# Patient Record
Sex: Male | Born: 1960 | Race: Black or African American | Hispanic: No | Marital: Married | Smoking: Never smoker
Health system: Southern US, Community
[De-identification: ages and names within clinical notes are randomized; demographics above are authoritative.]

## PROBLEM LIST (undated history)

## (undated) DIAGNOSIS — I10 Essential (primary) hypertension: Secondary | ICD-10-CM

## (undated) DIAGNOSIS — N189 Chronic kidney disease, unspecified: Secondary | ICD-10-CM

## (undated) DIAGNOSIS — K8020 Calculus of gallbladder without cholecystitis without obstruction: Secondary | ICD-10-CM

## (undated) HISTORY — PX: HERNIA REPAIR: SHX51

## (undated) HISTORY — DX: Calculus of gallbladder without cholecystitis without obstruction: K80.20

## (undated) HISTORY — PX: KIDNEY STONE SURGERY: SHX686

## (undated) HISTORY — DX: Essential (primary) hypertension: I10

## (undated) HISTORY — DX: Chronic kidney disease, unspecified: N18.9

---

## 2002-07-21 HISTORY — PX: CARPAL TUNNEL RELEASE: SHX101

## 2006-05-27 ENCOUNTER — Encounter: Admission: RE | Admit: 2006-05-27 | Discharge: 2006-05-27 | Payer: Self-pay | Admitting: Family Medicine

## 2009-08-22 ENCOUNTER — Emergency Department (HOSPITAL_COMMUNITY): Admission: EM | Admit: 2009-08-22 | Discharge: 2009-08-22 | Payer: Self-pay | Admitting: Emergency Medicine

## 2011-03-13 ENCOUNTER — Other Ambulatory Visit: Payer: Self-pay | Admitting: Family Medicine

## 2011-03-18 ENCOUNTER — Other Ambulatory Visit: Payer: Self-pay

## 2011-03-19 ENCOUNTER — Other Ambulatory Visit: Payer: Self-pay | Admitting: Family Medicine

## 2011-03-19 ENCOUNTER — Ambulatory Visit
Admission: RE | Admit: 2011-03-19 | Discharge: 2011-03-19 | Disposition: A | Discharge status: Home / Self Care | Payer: BC Managed Care – PPO | Source: Ambulatory Visit | Attending: Family Medicine | Admitting: Family Medicine

## 2011-04-08 ENCOUNTER — Ambulatory Visit (INDEPENDENT_AMBULATORY_CARE_PROVIDER_SITE_OTHER): Payer: BC Managed Care – PPO | Admitting: Surgery

## 2011-04-18 ENCOUNTER — Encounter (INDEPENDENT_AMBULATORY_CARE_PROVIDER_SITE_OTHER): Payer: Self-pay | Admitting: General Surgery

## 2011-04-18 ENCOUNTER — Ambulatory Visit (INDEPENDENT_AMBULATORY_CARE_PROVIDER_SITE_OTHER): Payer: BC Managed Care – PPO | Admitting: General Surgery

## 2011-04-18 DIAGNOSIS — K8020 Calculus of gallbladder without cholecystitis without obstruction: Secondary | ICD-10-CM

## 2011-04-18 NOTE — Progress Notes (Signed)
Chief Complaint  Patient presents with  . Other    NP- eval Gb with stones    HPI Daniel Velez is a 50 y.o. male.   HPI  50 year old African American male referred by his primary care physician for evaluation of gallstones. The patient states that he was on his vacation several weeks ago and he ate a lot of food at one time. Shortly thereafter he started belching. He had no abdominal pain. He had no nausea or vomiting. He had no bloating. He went to see his doctor who gave a medication for acid reflux. He has had  no other belching episodes. He denies any diarrhea or constipation. He denies any acholic stools. He denies any fevers or chills. He denies any jaundice or scleral icterus. He underwent abdominal ultrasound which demonstrated gallstones which prompted his referral to our office. Past Medical History  Diagnosis Date  . Gallstones   . Hypertension   . Chronic kidney disease     history of kidney stones    Past Surgical History  Procedure Date  . Kidney stone surgery 1978, 1983  . Hernia repair     umbilical hernia  . Carpal tunnel release 2004    Family History  Problem Relation Age of Onset  . Cancer Mother     throat  . Cancer Maternal Grandmother     pancreatic  . Cancer Paternal Grandmother     breast    Social History History  Substance Use Topics  . Smoking status: Never Smoker   . Smokeless tobacco: Not on file  . Alcohol Use: Yes    No Known Allergies  Current Outpatient Prescriptions  Medication Sig Dispense Refill  . BENICAR HCT 40-25 MG per tablet daily.      . niacin 500 MG tablet Take 500 mg by mouth daily with breakfast.          Review of Systems Review of Systems  Constitutional: Negative for fever, chills, activity change, appetite change and unexpected weight change.  HENT: Negative for neck pain and neck stiffness.   Eyes: Negative for photophobia and visual disturbance.  Respiratory: Negative for chest tightness, shortness of  breath and wheezing.   Cardiovascular: Negative for chest pain, palpitations and leg swelling.  Gastrointestinal: Negative for nausea, vomiting, abdominal pain, diarrhea, constipation, blood in stool and abdominal distention.  Genitourinary: Negative for dysuria, hematuria and difficulty urinating.  Musculoskeletal: Negative.   Neurological: Negative.   Hematological: Negative.   Psychiatric/Behavioral: Negative.     Blood pressure 104/74, pulse 64, temperature 97.5 F (36.4 C), temperature source Temporal, resp. rate 16, height 6' (1.829 m), weight 204 lb 9.6 oz (92.806 kg).  Physical Exam Physical Exam  Vitals reviewed. Constitutional: He is oriented to person, place, and time. He appears well-developed and well-nourished.  HENT:  Head: Normocephalic and atraumatic.  Eyes: Conjunctivae are normal. No scleral icterus.  Neck: Normal range of motion. Neck supple. No thyromegaly present.  Cardiovascular: Normal rate, regular rhythm and normal heart sounds.   Pulmonary/Chest: Effort normal and breath sounds normal. He has no wheezes.  Abdominal: Soft. Bowel sounds are normal. He exhibits no distension and no mass. There is no tenderness. There is no guarding.  Musculoskeletal: Normal range of motion.  Lymphadenopathy:    He has no cervical adenopathy.  Neurological: He is alert and oriented to person, place, and time. He exhibits normal muscle tone.  Skin: Skin is warm and dry.  Psychiatric: He has a normal mood and  affect. His behavior is normal. Judgment and thought content normal.    Data Reviewed LIMITED ABDOMINAL ULTRASOUND - RIGHT UPPER QUADRANT 03/19/11 Comparison: None.  Findings:  Gallbladder: Several small gallstones are seen measuring up to 1  cm in size. Gallbladder is nondilated and there is no evidence of  gallbladder wall thickening or sonographic Murphy's sign.   Common bile duct: Within normal limits in caliber. 5 mm in  diameter.  Liver: No focal lesion  identified. Within normal limits in  parenchymal echogenicity.   IMPRESSION:  Cholelithiasis. No sonographic evidence of acute cholecystitis or  biliary dilatation.   Assessment    asymptomatic cholelithiasis HTN Dyslipidemia     Plan    We discussed the etiology of gallstones. We also discussed gallbladder disease. We also discussed the signs and symptoms of symptomatic cholelithiasis and acute cholecystitis. The patient was given educational material.  Since he is asymptomatic, I recommended observation. I encouraged the patient to contact our office if and when he should become symptomatic with respect with gallstones. I will see him on a p.r.n. basis       [name] 04/18/2011, 9:25 AM

## 2011-04-18 NOTE — Patient Instructions (Addendum)
Call or return when you become symptomatic  Cholelithiasis, Gallbladder Disease (Gallstones) Gallstones are a form of gallbladder disease. When there is an infection of the gallbladder it is called cholecystitis. It is usually caused by a build-up of stones (gallstones or cholelithiasis) in your gallbladder. The gallbladder is not an essential organ. This means it is not necessary for life. It is located slightly to the right of center in the belly (abdomen), behind the liver. It stores bile made in the liver. Bile aids in digestion and absorption of fats. Gallbladder disease may result in feeling sick to your stomach (nausea), abdominal pain, and jaundice. In severe cases, emergency surgery may be required. Gallstones are the most common type of gallbladder disease. They begin as small crystals and slowly grow into stones. Gallstone pain occurs when the gallbladder spasms and a gallstone is blocking the duct. Pain can also occur when a stone passes out of the duct. The pain usually begins suddenly. It may persist from several minutes to several hours. Infection can occur. Infection can add to discomfort and severity of an acute attack. The pain may be made worse by breathing deeply or by being jarred. There may be fever and tenderness to the touch. In some cases, when gallstones do not move into the bile duct, people have no pain or symptoms. These are called "silent" gallstones. Women are three times more likely to develop gallstones than men. Women who have had several pregnancies are more likely to have gallbladder disease. Physicians sometimes advise removing diseased gallbladders before future pregnancies. Other factors that increase the risk of gallbladder disease are obesity, diets heavy in fried foods and dairy products, increasing age, prolonged use of medications containing male hormones, and heredity. HOME CARE INSTRUCTIONS  Only take over-the-counter or prescription medicines for pain,  discomfort, or fever as directed by your caregiver.   Follow a low fat diet until seen again. (Fat causes the gallbladder to contract.)   Follow-up as instructed. Attacks are almost always recurrent and surgery is usually required for permanent treatment.  SEEK IMMEDIATE MEDICAL CARE IF:  Pain is increasing and is not controlled by medications.   You have an oral temperature above 101, not controlled by medication.   You develop nausea and vomiting.  MAKE SURE YOU:   Understand these instructions.   Will watch your condition.   Will get help right away if you are not doing well or get worse.  Document Released: 07/03/2005 Document Re-Released: 06/19/2008 [facility] Patient Information 2011 [place], [place].

## 2012-02-09 IMAGING — US US ABDOMEN LIMITED
1 series · 14 of 25 positions shown · non-contrast
Comparison: None.

CLINICAL DATA: Post prandial abdominal pain and belching.

LIMITED ABDOMINAL ULTRASOUND - RIGHT UPPER QUADRANT

[Series 1: us abdomen limited · 0.16mm/px · 14 of 47 slices shown]
[im 1/47]
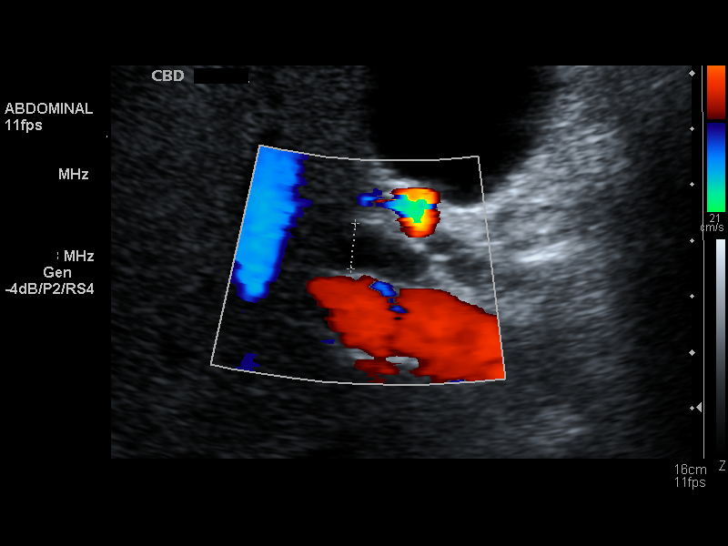
[im 4/47]
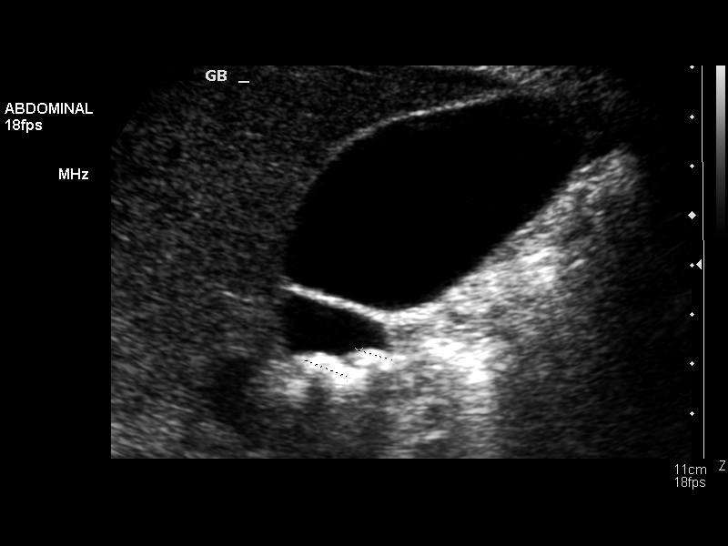
[im 8/47]
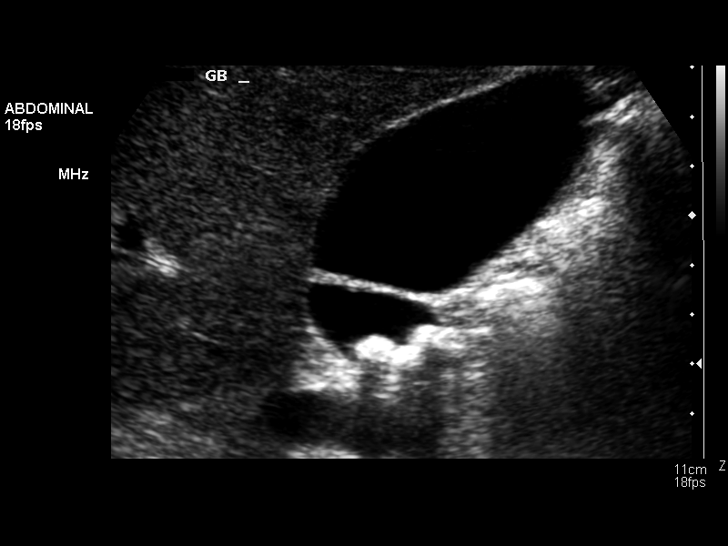
[im 12/47]
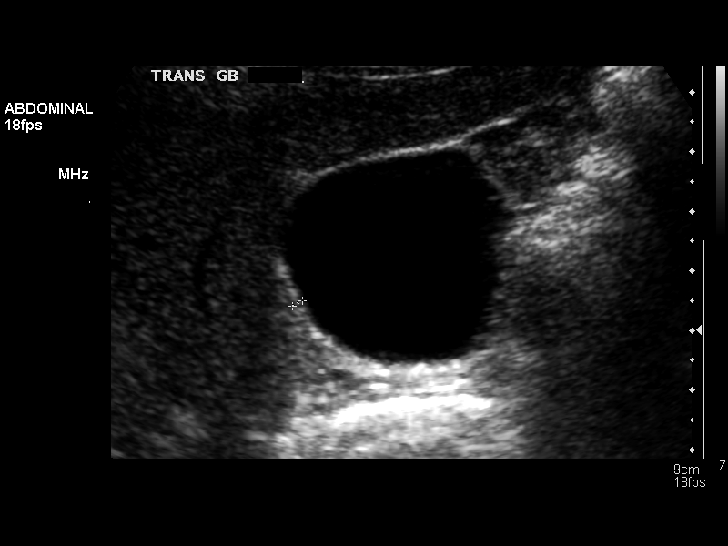
[im 16/47]
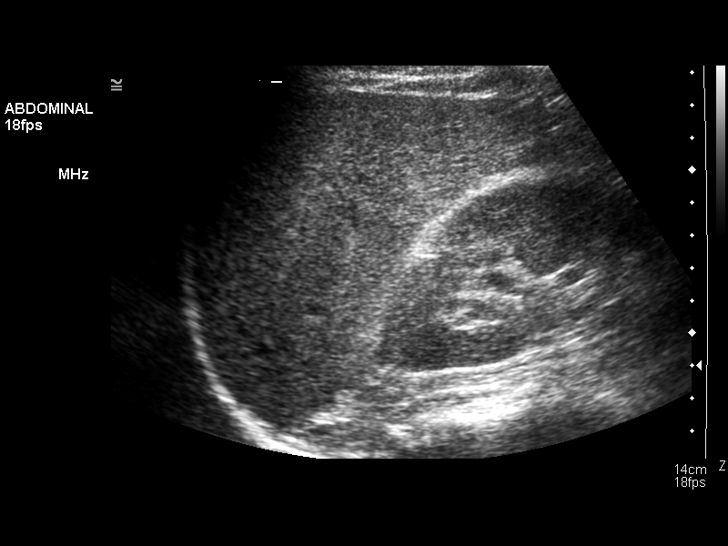
[im 18/47]
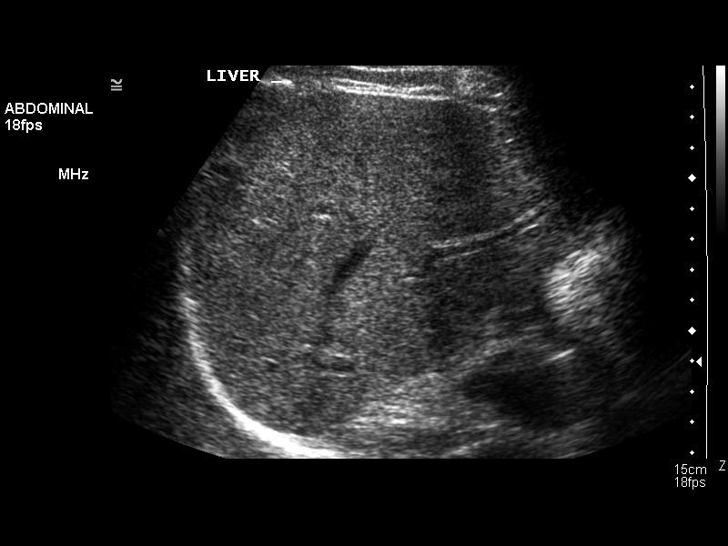
[im 22/47]
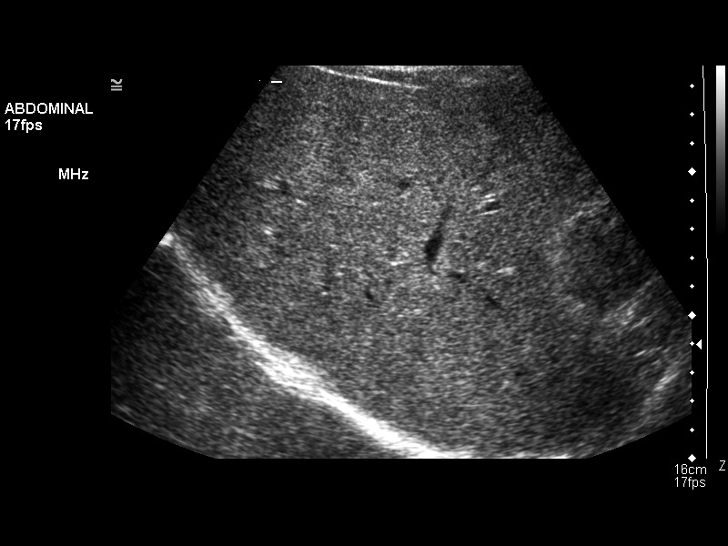
[im 25/47]
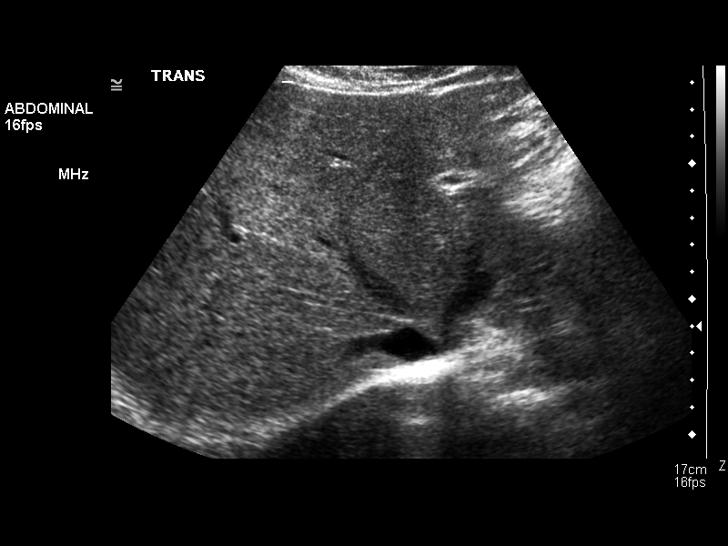
[im 29/47]
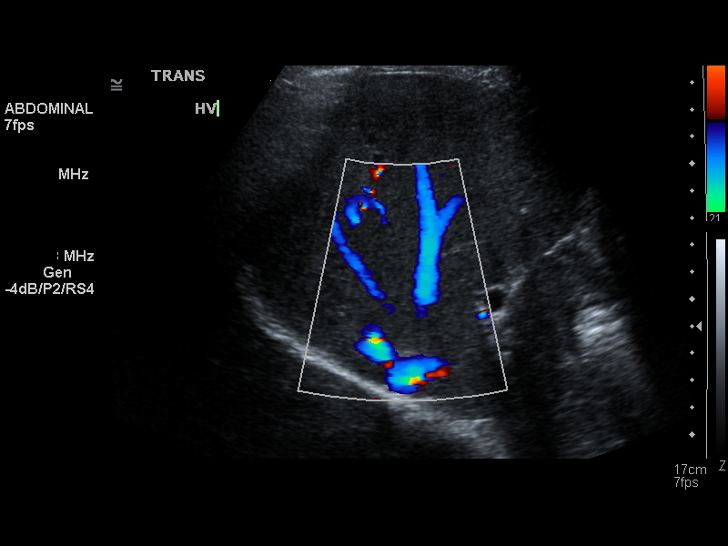
[im 31/47]
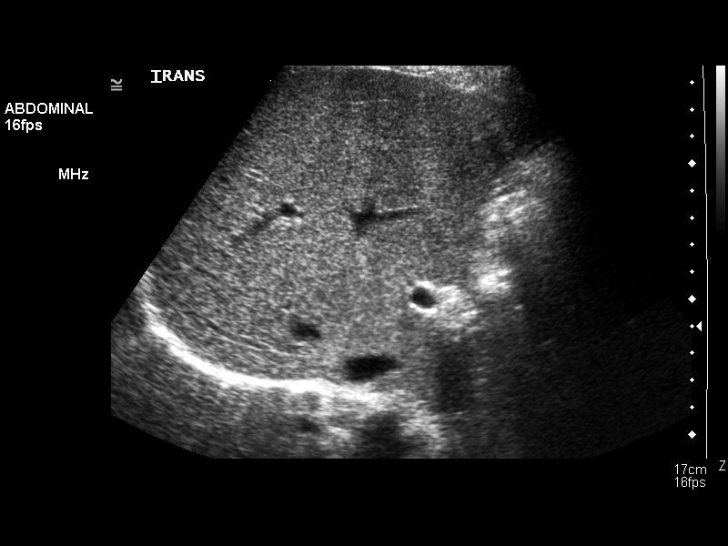
[im 35/47]
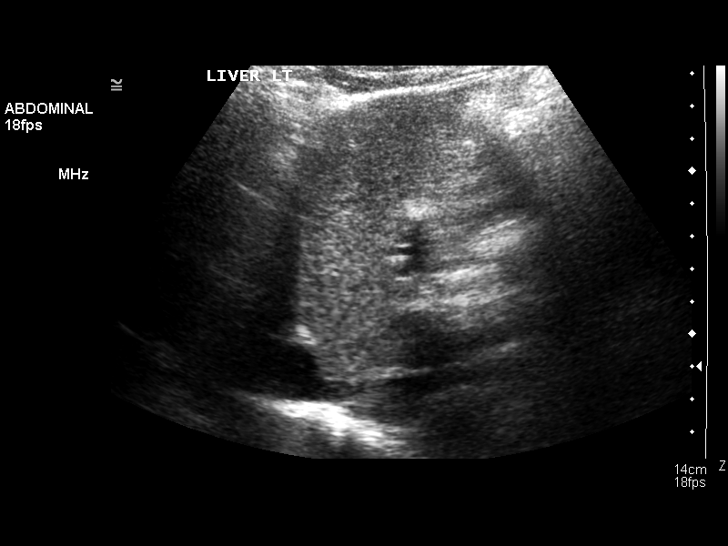
[im 39/47]
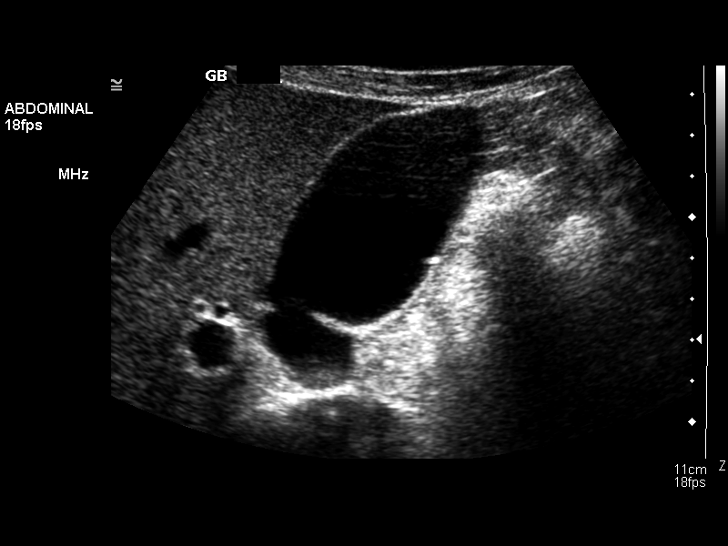
[im 43/47]
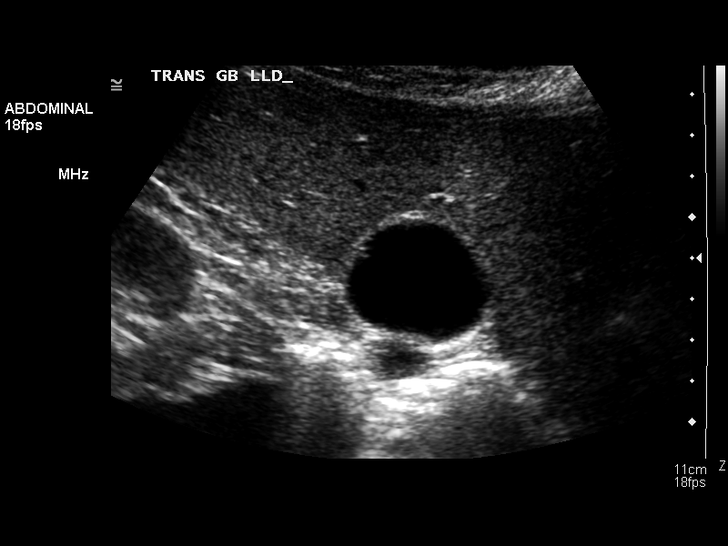
[im 47/47]
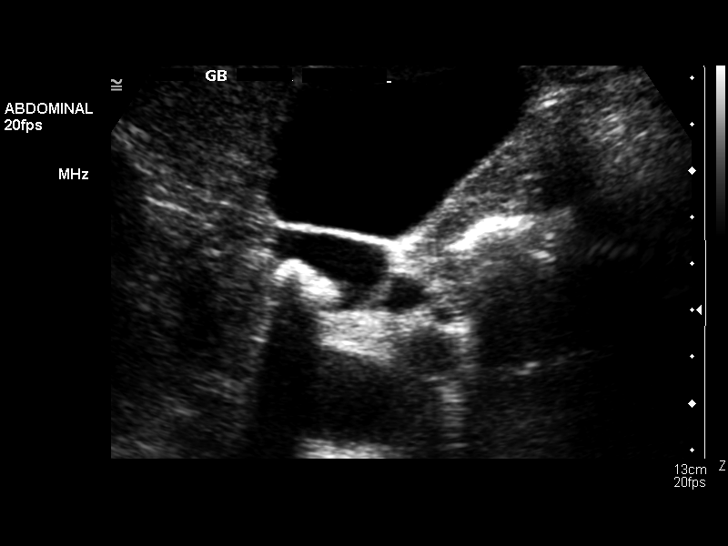

[14 of 25 positions shown; findings below may reference images not displayed]

FINDINGS: Gallbladder:  Several small gallstones are seen measuring up to 1
cm in size.  Gallbladder is nondilated and there is no evidence of
gallbladder wall thickening or sonographic Murphy's sign.

Common bile duct:  Within normal limits in caliber. 5 mm in
diameter.

Liver:  No focal lesion identified.  Within normal limits in
parenchymal echogenicity.
IMPRESSION: Cholelithiasis.  No sonographic evidence of acute cholecystitis or
biliary dilatation.

## 2012-10-19 HISTORY — PX: CERVICAL FUSION: SHX112

## 2013-07-12 ENCOUNTER — Encounter: Payer: Self-pay | Admitting: Internal Medicine

## 2014-08-02 ENCOUNTER — Encounter: Payer: Self-pay | Admitting: Internal Medicine

## 2014-08-30 DIAGNOSIS — Z0130 Encounter for examination of blood pressure without abnormal findings: Secondary | ICD-10-CM | POA: Insufficient documentation

## 2014-09-21 ENCOUNTER — Ambulatory Visit (AMBULATORY_SURGERY_CENTER): Payer: Self-pay

## 2014-09-21 VITALS — Ht 72.0 in | Wt 214.6 lb

## 2014-09-21 DIAGNOSIS — Z1211 Encounter for screening for malignant neoplasm of colon: Secondary | ICD-10-CM

## 2014-09-21 MED ORDER — MOVIPREP 100 G PO SOLR: 1.0000 | Freq: Once | ORAL | Status: DC

## 2014-09-21 NOTE — Progress Notes (Signed)
No allergies to eggs or soy No home oxygen No diet/weight loss meds No past problems with anesthesia  Has email  Emmi instructions given for colonoscopy 

## 2014-10-05 ENCOUNTER — Ambulatory Visit (AMBULATORY_SURGERY_CENTER): Payer: BLUE CROSS/BLUE SHIELD | Admitting: Internal Medicine

## 2014-10-05 ENCOUNTER — Encounter: Payer: Self-pay | Admitting: Internal Medicine

## 2014-10-05 VITALS — BP 111/74 | HR 65 | Temp 96.6°F | Resp 12 | Ht 72.0 in | Wt 214.0 lb

## 2014-10-05 DIAGNOSIS — D124 Benign neoplasm of descending colon: Secondary | ICD-10-CM | POA: Diagnosis not present

## 2014-10-05 DIAGNOSIS — Z1211 Encounter for screening for malignant neoplasm of colon: Secondary | ICD-10-CM

## 2014-10-05 DIAGNOSIS — D123 Benign neoplasm of transverse colon: Secondary | ICD-10-CM

## 2014-10-05 MED ORDER — SODIUM CHLORIDE 0.9 % IV SOLN: 500.0000 mL | INTRAVENOUS | Status: DC

## 2014-10-05 NOTE — Patient Instructions (Signed)
YOU HAD AN ENDOSCOPIC PROCEDURE TODAY AT THE [facility]:   Refer to the procedure report that was given to you for any specific questions about what was found during the examination.  If the procedure report does not answer your questions, please call your gastroenterologist to clarify.  If you requested that your care partner not be given the details of your procedure findings, then the procedure report has been included in a sealed envelope for you to review at your convenience later.  YOU SHOULD EXPECT: Some feelings of bloating in the abdomen. Passage of more gas than usual.  Walking can help get rid of the air that was put into your GI tract during the procedure and reduce the bloating. If you had a lower endoscopy (such as a colonoscopy or flexible sigmoidoscopy) you may notice spotting of blood in your stool or on the toilet paper. If you underwent a bowel prep for your procedure, you may not have a normal bowel movement for a few days.  Please Note:  You might notice some irritation and congestion in your nose or some drainage.  This is from the oxygen used during your procedure.  There is no need for concern and it should clear up in a day or so.  SYMPTOMS TO REPORT IMMEDIATELY:   Following lower endoscopy (colonoscopy or flexible sigmoidoscopy):  Excessive amounts of blood in the stool  Significant tenderness or worsening of abdominal pains  Swelling of the abdomen that is new, acute  Fever of 100F or higher   For urgent or emergent issues, a gastroenterologist can be reached at any hour by calling [phone].   DIET: Your first meal following the procedure should be a small meal and then it is ok to progress to your normal diet. Heavy or fried foods are harder to digest and may make you feel nauseous or bloated.  Likewise, meals heavy in dairy and vegetables can increase bloating.  Drink plenty of fluids but you should avoid alcoholic beverages for 24  hours.  ACTIVITY:  You should plan to take it easy for the rest of today and you should NOT DRIVE or use heavy machinery until tomorrow (because of the sedation medicines used during the test).    FOLLOW UP: Our staff will call the number listed on your records the next business day following your procedure to check on you and address any questions or concerns that you may have regarding the information given to you following your procedure. If we do not reach you, we will leave a message.  However, if you are feeling well and you are not experiencing any problems, there is no need to return our call.  We will assume that you have returned to your regular daily activities without incident.  If any biopsies were taken you will be contacted by phone or by letter within the next 1-3 weeks.  Please call us at [phone] if you have not heard about the biopsies in 3 weeks.    SIGNATURES/CONFIDENTIALITY: You and/or your care partner have signed paperwork which will be entered into your electronic medical record.  These signatures attest to the fact that that the information above on your After Visit Summary has been reviewed and is understood.  Full responsibility of the confidentiality of this discharge information lies with you and/or your care-partner. 

## 2014-10-05 NOTE — Progress Notes (Signed)
Called to room to assist during endoscopic procedure.  Patient ID and intended procedure confirmed with present staff. Received instructions for my participation in the procedure from the performing physician.  

## 2014-10-05 NOTE — Progress Notes (Signed)
Patient awakening,vss,report to rn 

## 2014-10-05 NOTE — Op Note (Signed)
[facility]  [address]. [place], [address]   COLONOSCOPY PROCEDURE REPORT  PATIENT: Daniel Velez, Daniel Velez  MR#: [id number] BIRTHDATE: April 05, 1961 , 62  yrs. old GENDER: male ENDOSCOPIST: Jerene Bears, MD REFERRED PJ:[id number] Ardeth Perfect, M.D. PROCEDURE DATE:  10/05/2014 PROCEDURE:   Colonoscopy with snare polypectomy and Colonoscopy with cold biopsy polypectomy First Screening Colonoscopy - Avg.  risk and is 50 yrs.  old or older Yes.  Prior Negative Screening - Now for repeat screening. N/A  History of Adenoma - Now for follow-up colonoscopy & has been > or = to 3 yrs.  N/A ASA CLASS:   Class II INDICATIONS:Screening for colonic neoplasia and Colorectal Neoplasm Risk Assessment for this procedure is average risk. MEDICATIONS: Monitored anesthesia care and Propofol 200 mg IV  DESCRIPTION OF PROCEDURE:   After the risks benefits and alternatives of the procedure were thoroughly explained, informed consent was obtained.  The digital rectal exam revealed no rectal mass.   The LB [id number]  endoscope was introduced through the anus and advanced to the cecum, which was identified by both the appendix and ileocecal valve. No adverse events experienced. The quality of the prep was (MoviPrep was used) good.  The instrument was then slowly withdrawn as the colon was fully examined.   COLON FINDINGS: Three sessile polyps ranging from 3 to 18mm in size were found in the transverse colon (2) and descending colon (1). Polypectomies were performed with cold forceps (2) and with a cold snare (1).  The resection was complete, the polyp tissue was completely retrieved and sent to histology.   The examination was otherwise normal.  Retroflexed views revealed internal hemorrhoids. The time to cecum = 1 min, 47 sec Withdrawal time = 10 min 20 sec The scope was withdrawn and the procedure completed. COMPLICATIONS: There were no immediate complications.  ENDOSCOPIC IMPRESSION: 1.    Three sessile polyps ranging from 3 to 66mm in size were found in the transverse colon and descending colon; polypectomies were performed with cold forceps and with a cold snare 2.   The examination was otherwise normal  RECOMMENDATIONS: 1.  Await pathology results 2.  Timing of repeat colonoscopy will be determined by pathology findings. 3.  You will receive a letter within 1-2 weeks with the results of your biopsy as well as final recommendations.  Please call my office if you have not received a letter after 3 weeks.  eSigned:  Jerene Bears, MD 10/05/2014 9:38 AM   cc: The Patient, Velna Hatchet, M.D.

## 2014-10-06 ENCOUNTER — Telehealth: Payer: Self-pay | Admitting: *Deleted

## 2014-10-06 NOTE — Telephone Encounter (Signed)
  Follow up Call-  Call back number 10/05/2014  Post procedure Call Back phone  # [phone]  Permission to leave phone message Yes     Patient questions:  Do you have a fever, pain , or abdominal swelling? No. Pain Score  0 *  Have you tolerated food without any problems? Yes.    Have you been able to return to your normal activities? Yes.    Do you have any questions about your discharge instructions: Diet   No. Medications  No. Follow up visit  No.  Do you have questions or concerns about your Care? No.  Actions: * If pain score is 4 or above: No action needed, pain <4.

## 2014-10-10 ENCOUNTER — Encounter: Payer: Self-pay | Admitting: Internal Medicine

## 2015-12-07 DIAGNOSIS — R51 Headache: Secondary | ICD-10-CM | POA: Diagnosis not present

## 2015-12-07 DIAGNOSIS — Z6830 Body mass index (BMI) 30.0-30.9, adult: Secondary | ICD-10-CM | POA: Diagnosis not present

## 2015-12-31 DIAGNOSIS — Z87898 Personal history of other specified conditions: Secondary | ICD-10-CM | POA: Diagnosis not present

## 2015-12-31 DIAGNOSIS — N400 Enlarged prostate without lower urinary tract symptoms: Secondary | ICD-10-CM | POA: Diagnosis not present

## 2016-04-08 DIAGNOSIS — J0100 Acute maxillary sinusitis, unspecified: Secondary | ICD-10-CM | POA: Diagnosis not present

## 2016-04-08 DIAGNOSIS — Z6830 Body mass index (BMI) 30.0-30.9, adult: Secondary | ICD-10-CM | POA: Diagnosis not present

## 2016-04-08 DIAGNOSIS — J302 Other seasonal allergic rhinitis: Secondary | ICD-10-CM | POA: Diagnosis not present

## 2016-09-15 DIAGNOSIS — Z125 Encounter for screening for malignant neoplasm of prostate: Secondary | ICD-10-CM | POA: Diagnosis not present

## 2016-09-15 DIAGNOSIS — Z0000 Encounter for general adult medical examination without abnormal findings: Secondary | ICD-10-CM | POA: Diagnosis not present

## 2016-09-15 DIAGNOSIS — I10 Essential (primary) hypertension: Secondary | ICD-10-CM | POA: Diagnosis not present

## 2016-09-18 ENCOUNTER — Encounter (HOSPITAL_COMMUNITY): Payer: Self-pay | Admitting: Emergency Medicine

## 2016-09-18 ENCOUNTER — Emergency Department (HOSPITAL_COMMUNITY)
Admission: EM | Admit: 2016-09-18 | Discharge: 2016-09-18 | Disposition: A | Discharge status: Home / Self Care | Payer: BLUE CROSS/BLUE SHIELD | Attending: Emergency Medicine | Admitting: Emergency Medicine

## 2016-09-18 DIAGNOSIS — Y999 Unspecified external cause status: Secondary | ICD-10-CM | POA: Insufficient documentation

## 2016-09-18 DIAGNOSIS — Y939 Activity, unspecified: Secondary | ICD-10-CM | POA: Diagnosis not present

## 2016-09-18 DIAGNOSIS — Y92410 Unspecified street and highway as the place of occurrence of the external cause: Secondary | ICD-10-CM | POA: Diagnosis not present

## 2016-09-18 DIAGNOSIS — N189 Chronic kidney disease, unspecified: Secondary | ICD-10-CM | POA: Diagnosis not present

## 2016-09-18 DIAGNOSIS — Z79899 Other long term (current) drug therapy: Secondary | ICD-10-CM | POA: Insufficient documentation

## 2016-09-18 DIAGNOSIS — M542 Cervicalgia: Secondary | ICD-10-CM | POA: Diagnosis not present

## 2016-09-18 DIAGNOSIS — I129 Hypertensive chronic kidney disease with stage 1 through stage 4 chronic kidney disease, or unspecified chronic kidney disease: Secondary | ICD-10-CM | POA: Diagnosis not present

## 2016-09-18 MED ORDER — IBUPROFEN 200 MG PO TABS
600.0000 mg | ORAL_TABLET | Freq: Once | ORAL | Status: AC
  Administered 2016-09-18: 600 mg via ORAL
  Filled 2016-09-18: qty 3

## 2016-09-18 MED ORDER — NAPROXEN 500 MG PO TABS: 500.0000 mg | ORAL_TABLET | Freq: Two times a day (BID) | ORAL | 0 refills | Status: DC

## 2016-09-18 MED ORDER — METHOCARBAMOL 500 MG PO TABS: 500.0000 mg | ORAL_TABLET | Freq: Three times a day (TID) | ORAL | 0 refills | Status: DC

## 2016-09-18 MED ORDER — ACETAMINOPHEN 325 MG PO TABS
650.0000 mg | ORAL_TABLET | Freq: Once | ORAL | Status: AC
  Administered 2016-09-18: 650 mg via ORAL
  Filled 2016-09-18: qty 2

## 2016-09-18 MED ORDER — METHOCARBAMOL 500 MG PO TABS
750.0000 mg | ORAL_TABLET | Freq: Once | ORAL | Status: AC
  Administered 2016-09-18: 750 mg via ORAL
  Filled 2016-09-18: qty 2

## 2016-09-18 MED ORDER — METHOCARBAMOL 500 MG PO TABS: 500.0000 mg | ORAL_TABLET | Freq: Three times a day (TID) | ORAL | 0 refills | Status: AC

## 2016-09-18 MED ORDER — METHOCARBAMOL 500 MG PO TABS: 500.0000 mg | ORAL_TABLET | Freq: Two times a day (BID) | ORAL | 0 refills | Status: DC

## 2016-09-18 NOTE — ED Notes (Signed)
Bed: WTR8 Expected date:  Expected time:  Means of arrival:  Comments: 
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
## 2016-09-22 DIAGNOSIS — E291 Testicular hypofunction: Secondary | ICD-10-CM | POA: Diagnosis not present

## 2016-09-22 DIAGNOSIS — Z23 Encounter for immunization: Secondary | ICD-10-CM | POA: Diagnosis not present

## 2016-09-22 DIAGNOSIS — Z0000 Encounter for general adult medical examination without abnormal findings: Secondary | ICD-10-CM | POA: Diagnosis not present

## 2016-09-22 DIAGNOSIS — Z1389 Encounter for screening for other disorder: Secondary | ICD-10-CM | POA: Diagnosis not present

## 2016-09-22 DIAGNOSIS — Z87898 Personal history of other specified conditions: Secondary | ICD-10-CM | POA: Diagnosis not present

## 2016-09-22 DIAGNOSIS — N400 Enlarged prostate without lower urinary tract symptoms: Secondary | ICD-10-CM | POA: Diagnosis not present

## 2016-09-22 DIAGNOSIS — Z86010 Personal history of colonic polyps: Secondary | ICD-10-CM | POA: Diagnosis not present

## 2016-09-23 DIAGNOSIS — M5031 Other cervical disc degeneration,  high cervical region: Secondary | ICD-10-CM | POA: Diagnosis not present

## 2016-09-23 DIAGNOSIS — M542 Cervicalgia: Secondary | ICD-10-CM | POA: Diagnosis not present

## 2016-10-02 DIAGNOSIS — Z1212 Encounter for screening for malignant neoplasm of rectum: Secondary | ICD-10-CM | POA: Diagnosis not present

## 2016-12-19 DIAGNOSIS — Z6830 Body mass index (BMI) 30.0-30.9, adult: Secondary | ICD-10-CM | POA: Diagnosis not present

## 2016-12-19 DIAGNOSIS — I10 Essential (primary) hypertension: Secondary | ICD-10-CM | POA: Diagnosis not present

## 2017-01-07 DIAGNOSIS — H6123 Impacted cerumen, bilateral: Secondary | ICD-10-CM | POA: Insufficient documentation

## 2017-09-16 DIAGNOSIS — Z0000 Encounter for general adult medical examination without abnormal findings: Secondary | ICD-10-CM | POA: Diagnosis not present

## 2017-09-16 DIAGNOSIS — F5221 Male erectile disorder: Secondary | ICD-10-CM | POA: Diagnosis not present

## 2017-09-16 DIAGNOSIS — R82998 Other abnormal findings in urine: Secondary | ICD-10-CM | POA: Diagnosis not present

## 2017-09-16 DIAGNOSIS — E291 Testicular hypofunction: Secondary | ICD-10-CM | POA: Diagnosis not present

## 2017-09-16 DIAGNOSIS — Z125 Encounter for screening for malignant neoplasm of prostate: Secondary | ICD-10-CM | POA: Diagnosis not present

## 2017-09-16 DIAGNOSIS — I10 Essential (primary) hypertension: Secondary | ICD-10-CM | POA: Diagnosis not present

## 2017-09-23 DIAGNOSIS — Z86010 Personal history of colonic polyps: Secondary | ICD-10-CM | POA: Diagnosis not present

## 2017-09-23 DIAGNOSIS — H9312 Tinnitus, left ear: Secondary | ICD-10-CM | POA: Diagnosis not present

## 2017-09-23 DIAGNOSIS — Z0000 Encounter for general adult medical examination without abnormal findings: Secondary | ICD-10-CM | POA: Diagnosis not present

## 2017-09-23 DIAGNOSIS — Z1389 Encounter for screening for other disorder: Secondary | ICD-10-CM | POA: Diagnosis not present

## 2017-09-23 DIAGNOSIS — N400 Enlarged prostate without lower urinary tract symptoms: Secondary | ICD-10-CM | POA: Diagnosis not present

## 2017-09-23 DIAGNOSIS — I10 Essential (primary) hypertension: Secondary | ICD-10-CM | POA: Diagnosis not present

## 2017-09-25 DIAGNOSIS — Z1212 Encounter for screening for malignant neoplasm of rectum: Secondary | ICD-10-CM | POA: Diagnosis not present

## 2017-09-27 DIAGNOSIS — M25571 Pain in right ankle and joints of right foot: Secondary | ICD-10-CM | POA: Diagnosis not present

## 2017-10-01 ENCOUNTER — Encounter: Payer: Self-pay | Admitting: *Deleted

## 2017-10-04 ENCOUNTER — Encounter: Payer: Self-pay | Admitting: Internal Medicine

## 2017-10-20 ENCOUNTER — Encounter: Payer: Self-pay | Admitting: Internal Medicine

## 2018-05-07 DIAGNOSIS — B029 Zoster without complications: Secondary | ICD-10-CM | POA: Diagnosis not present

## 2018-05-07 DIAGNOSIS — Z6828 Body mass index (BMI) 28.0-28.9, adult: Secondary | ICD-10-CM | POA: Diagnosis not present

## 2018-05-07 DIAGNOSIS — I10 Essential (primary) hypertension: Secondary | ICD-10-CM | POA: Diagnosis not present

## 2018-09-22 DIAGNOSIS — I10 Essential (primary) hypertension: Secondary | ICD-10-CM | POA: Diagnosis not present

## 2018-09-22 DIAGNOSIS — R82998 Other abnormal findings in urine: Secondary | ICD-10-CM | POA: Diagnosis not present

## 2018-09-22 DIAGNOSIS — Z125 Encounter for screening for malignant neoplasm of prostate: Secondary | ICD-10-CM | POA: Diagnosis not present

## 2018-09-22 DIAGNOSIS — E291 Testicular hypofunction: Secondary | ICD-10-CM | POA: Diagnosis not present

## 2018-09-22 DIAGNOSIS — Z0000 Encounter for general adult medical examination without abnormal findings: Secondary | ICD-10-CM | POA: Diagnosis not present

## 2018-09-29 DIAGNOSIS — N400 Enlarged prostate without lower urinary tract symptoms: Secondary | ICD-10-CM | POA: Diagnosis not present

## 2018-09-29 DIAGNOSIS — Z1331 Encounter for screening for depression: Secondary | ICD-10-CM | POA: Diagnosis not present

## 2018-09-29 DIAGNOSIS — I10 Essential (primary) hypertension: Secondary | ICD-10-CM | POA: Diagnosis not present

## 2018-09-29 DIAGNOSIS — F5221 Male erectile disorder: Secondary | ICD-10-CM | POA: Diagnosis not present

## 2018-09-29 DIAGNOSIS — Z0000 Encounter for general adult medical examination without abnormal findings: Secondary | ICD-10-CM | POA: Diagnosis not present

## 2018-10-05 DIAGNOSIS — Z1212 Encounter for screening for malignant neoplasm of rectum: Secondary | ICD-10-CM | POA: Diagnosis not present

## 2019-03-21 DIAGNOSIS — I10 Essential (primary) hypertension: Secondary | ICD-10-CM | POA: Diagnosis not present

## 2019-03-21 DIAGNOSIS — H8110 Benign paroxysmal vertigo, unspecified ear: Secondary | ICD-10-CM | POA: Diagnosis not present

## 2019-03-21 DIAGNOSIS — H9193 Unspecified hearing loss, bilateral: Secondary | ICD-10-CM | POA: Diagnosis not present

## 2019-03-21 DIAGNOSIS — H6123 Impacted cerumen, bilateral: Secondary | ICD-10-CM | POA: Diagnosis not present

## 2019-07-23 DIAGNOSIS — M25561 Pain in right knee: Secondary | ICD-10-CM | POA: Diagnosis not present

## 2019-08-22 DIAGNOSIS — M25561 Pain in right knee: Secondary | ICD-10-CM | POA: Diagnosis not present

## 2019-09-22 ENCOUNTER — Ambulatory Visit: Payer: BC Managed Care – PPO | Attending: Internal Medicine

## 2019-09-22 DIAGNOSIS — Z23 Encounter for immunization: Secondary | ICD-10-CM | POA: Insufficient documentation

## 2019-09-22 NOTE — Progress Notes (Signed)
   Covid-19 Vaccination Clinic  Name:  Daniel Velez    MRN: [id number] DOB: 09/23/1960  09/22/2019  Mr. Payment was observed post Covid-19 immunization for 15 minutes without incident. He was provided with Vaccine Information Sheet and instruction to access the V-Safe system.   [name] was instructed to call 911 with any severe reactions post vaccine: Marland Kitchen Difficulty breathing  . Swelling of face and throat  . A fast heartbeat  . A bad rash all over body  . Dizziness and weakness   Immunizations Administered    Name Date Dose VIS Date Route   Pfizer COVID-19 Vaccine 09/22/2019  6:02 PM 0.3 mL 07/01/2019 Intramuscular   Manufacturer: [place]   Lot: [id number]   [place]: [id number]

## 2019-10-04 DIAGNOSIS — Z125 Encounter for screening for malignant neoplasm of prostate: Secondary | ICD-10-CM | POA: Diagnosis not present

## 2019-10-04 DIAGNOSIS — Z0000 Encounter for general adult medical examination without abnormal findings: Secondary | ICD-10-CM | POA: Diagnosis not present

## 2019-10-04 DIAGNOSIS — R7989 Other specified abnormal findings of blood chemistry: Secondary | ICD-10-CM | POA: Diagnosis not present

## 2019-10-10 DIAGNOSIS — I10 Essential (primary) hypertension: Secondary | ICD-10-CM | POA: Diagnosis not present

## 2019-10-10 DIAGNOSIS — R82998 Other abnormal findings in urine: Secondary | ICD-10-CM | POA: Diagnosis not present

## 2019-10-11 DIAGNOSIS — E291 Testicular hypofunction: Secondary | ICD-10-CM | POA: Diagnosis not present

## 2019-10-11 DIAGNOSIS — Z86010 Personal history of colonic polyps: Secondary | ICD-10-CM | POA: Diagnosis not present

## 2019-10-11 DIAGNOSIS — N400 Enlarged prostate without lower urinary tract symptoms: Secondary | ICD-10-CM | POA: Diagnosis not present

## 2019-10-11 DIAGNOSIS — Z0000 Encounter for general adult medical examination without abnormal findings: Secondary | ICD-10-CM | POA: Diagnosis not present

## 2019-10-11 DIAGNOSIS — Z1331 Encounter for screening for depression: Secondary | ICD-10-CM | POA: Diagnosis not present

## 2019-10-11 DIAGNOSIS — I10 Essential (primary) hypertension: Secondary | ICD-10-CM | POA: Diagnosis not present

## 2019-10-14 DIAGNOSIS — Z1212 Encounter for screening for malignant neoplasm of rectum: Secondary | ICD-10-CM | POA: Diagnosis not present

## 2019-10-19 ENCOUNTER — Ambulatory Visit: Payer: BC Managed Care – PPO | Attending: Internal Medicine

## 2019-10-19 DIAGNOSIS — Z23 Encounter for immunization: Secondary | ICD-10-CM

## 2019-10-19 NOTE — Progress Notes (Signed)
   Covid-19 Vaccination Clinic  Name:  Daniel Velez    MRN: [id number] DOB: 04-02-1961  10/19/2019  Mr. Rarey was observed post Covid-19 immunization for 15 minutes without incident. He was provided with Vaccine Information Sheet and instruction to access the V-Safe system.   Mr. Peregrino was instructed to call 911 with any severe reactions post vaccine: Marland Kitchen Difficulty breathing  . Swelling of face and throat  . A fast heartbeat  . A bad rash all over body  . Dizziness and weakness   Immunizations Administered    Name Date Dose VIS Date Route   Pfizer COVID-19 Vaccine 10/19/2019  4:13 PM 0.3 mL 07/01/2019 Intramuscular   Manufacturer: [place]   Lot: [id number]   [place]: [id number]

## 2019-10-29 DIAGNOSIS — M25572 Pain in left ankle and joints of left foot: Secondary | ICD-10-CM | POA: Diagnosis not present

## 2020-01-19 DIAGNOSIS — M25571 Pain in right ankle and joints of right foot: Secondary | ICD-10-CM | POA: Diagnosis not present

## 2020-01-19 DIAGNOSIS — M25562 Pain in left knee: Secondary | ICD-10-CM | POA: Diagnosis not present

## 2020-01-25 DIAGNOSIS — M542 Cervicalgia: Secondary | ICD-10-CM | POA: Diagnosis not present

## 2020-01-25 DIAGNOSIS — M4322 Fusion of spine, cervical region: Secondary | ICD-10-CM | POA: Diagnosis not present

## 2020-01-25 DIAGNOSIS — M5031 Other cervical disc degeneration,  high cervical region: Secondary | ICD-10-CM | POA: Diagnosis not present

## 2020-01-25 DIAGNOSIS — Z6829 Body mass index (BMI) 29.0-29.9, adult: Secondary | ICD-10-CM | POA: Diagnosis not present

## 2020-01-27 DIAGNOSIS — H8110 Benign paroxysmal vertigo, unspecified ear: Secondary | ICD-10-CM | POA: Diagnosis not present

## 2020-01-27 DIAGNOSIS — I10 Essential (primary) hypertension: Secondary | ICD-10-CM | POA: Diagnosis not present

## 2020-02-23 ENCOUNTER — Ambulatory Visit: Payer: BC Managed Care – PPO | Admitting: Physical Therapy

## 2020-03-08 ENCOUNTER — Ambulatory Visit: Payer: BC Managed Care – PPO | Admitting: Physical Therapy

## 2020-03-08 ENCOUNTER — Other Ambulatory Visit: Payer: Self-pay

## 2020-07-01 DIAGNOSIS — M25562 Pain in left knee: Secondary | ICD-10-CM | POA: Diagnosis not present

## 2020-07-15 DIAGNOSIS — R0981 Nasal congestion: Secondary | ICD-10-CM | POA: Diagnosis not present

## 2020-07-15 DIAGNOSIS — J069 Acute upper respiratory infection, unspecified: Secondary | ICD-10-CM | POA: Diagnosis not present

## 2020-07-15 DIAGNOSIS — Z20822 Contact with and (suspected) exposure to covid-19: Secondary | ICD-10-CM | POA: Diagnosis not present

## 2020-07-17 DIAGNOSIS — M4322 Fusion of spine, cervical region: Secondary | ICD-10-CM | POA: Diagnosis not present

## 2020-07-17 DIAGNOSIS — M5031 Other cervical disc degeneration,  high cervical region: Secondary | ICD-10-CM | POA: Diagnosis not present

## 2020-07-30 ENCOUNTER — Other Ambulatory Visit: Payer: BC Managed Care – PPO

## 2020-07-30 DIAGNOSIS — B349 Viral infection, unspecified: Secondary | ICD-10-CM | POA: Diagnosis not present

## 2020-07-30 DIAGNOSIS — Z20822 Contact with and (suspected) exposure to covid-19: Secondary | ICD-10-CM | POA: Diagnosis not present

## 2020-07-30 DIAGNOSIS — R059 Cough, unspecified: Secondary | ICD-10-CM | POA: Diagnosis not present

## 2020-10-09 DIAGNOSIS — I10 Essential (primary) hypertension: Secondary | ICD-10-CM | POA: Diagnosis not present

## 2020-10-09 DIAGNOSIS — E291 Testicular hypofunction: Secondary | ICD-10-CM | POA: Diagnosis not present

## 2020-10-09 DIAGNOSIS — R7989 Other specified abnormal findings of blood chemistry: Secondary | ICD-10-CM | POA: Diagnosis not present

## 2020-10-16 DIAGNOSIS — R82998 Other abnormal findings in urine: Secondary | ICD-10-CM | POA: Diagnosis not present

## 2020-10-16 DIAGNOSIS — Z0000 Encounter for general adult medical examination without abnormal findings: Secondary | ICD-10-CM | POA: Diagnosis not present

## 2020-10-16 DIAGNOSIS — Z1339 Encounter for screening examination for other mental health and behavioral disorders: Secondary | ICD-10-CM | POA: Diagnosis not present

## 2020-10-16 DIAGNOSIS — I10 Essential (primary) hypertension: Secondary | ICD-10-CM | POA: Diagnosis not present

## 2020-10-16 DIAGNOSIS — Z13828 Encounter for screening for other musculoskeletal disorder: Secondary | ICD-10-CM | POA: Diagnosis not present

## 2020-10-16 DIAGNOSIS — H6123 Impacted cerumen, bilateral: Secondary | ICD-10-CM | POA: Diagnosis not present

## 2020-10-16 DIAGNOSIS — Z1331 Encounter for screening for depression: Secondary | ICD-10-CM | POA: Diagnosis not present

## 2020-10-18 DIAGNOSIS — Z1212 Encounter for screening for malignant neoplasm of rectum: Secondary | ICD-10-CM | POA: Diagnosis not present

## 2020-10-24 DIAGNOSIS — M25572 Pain in left ankle and joints of left foot: Secondary | ICD-10-CM | POA: Diagnosis not present

## 2020-10-24 DIAGNOSIS — M109 Gout, unspecified: Secondary | ICD-10-CM | POA: Diagnosis not present

## 2020-11-16 DIAGNOSIS — M25572 Pain in left ankle and joints of left foot: Secondary | ICD-10-CM | POA: Diagnosis not present

## 2021-08-14 DIAGNOSIS — M25562 Pain in left knee: Secondary | ICD-10-CM | POA: Insufficient documentation

## 2022-03-22 DIAGNOSIS — M79671 Pain in right foot: Secondary | ICD-10-CM | POA: Insufficient documentation

## 2022-06-16 ENCOUNTER — Encounter: Payer: Self-pay | Admitting: Internal Medicine

## 2022-10-14 ENCOUNTER — Encounter: Payer: Self-pay | Admitting: Internal Medicine

## 2022-10-28 ENCOUNTER — Ambulatory Visit (AMBULATORY_SURGERY_CENTER): Payer: BC Managed Care – PPO

## 2022-10-28 ENCOUNTER — Encounter: Payer: Self-pay | Admitting: Internal Medicine

## 2022-10-28 VITALS — Ht 72.0 in | Wt 215.0 lb

## 2022-10-28 DIAGNOSIS — Z86010 Personal history of colonic polyps: Secondary | ICD-10-CM

## 2022-10-28 MED ORDER — NA SULFATE-K SULFATE-MG SULF 17.5-3.13-1.6 GM/177ML PO SOLN: 1.0000 | Freq: Once | ORAL | 0 refills | Status: AC

## 2022-10-28 NOTE — Progress Notes (Signed)

## 2022-11-17 ENCOUNTER — Encounter: Payer: Self-pay | Admitting: Internal Medicine

## 2022-11-17 ENCOUNTER — Ambulatory Visit (AMBULATORY_SURGERY_CENTER): Payer: BC Managed Care – PPO | Admitting: Internal Medicine

## 2022-11-17 VITALS — BP 101/67 | HR 64 | Temp 98.0°F | Resp 13 | Ht 72.0 in | Wt 215.0 lb

## 2022-11-17 DIAGNOSIS — D120 Benign neoplasm of cecum: Secondary | ICD-10-CM

## 2022-11-17 DIAGNOSIS — Z86010 Personal history of colonic polyps: Secondary | ICD-10-CM

## 2022-11-17 DIAGNOSIS — D122 Benign neoplasm of ascending colon: Secondary | ICD-10-CM

## 2022-11-17 DIAGNOSIS — Z09 Encounter for follow-up examination after completed treatment for conditions other than malignant neoplasm: Secondary | ICD-10-CM | POA: Diagnosis not present

## 2022-11-17 MED ORDER — SODIUM CHLORIDE 0.9 % IV SOLN: 500.0000 mL | Freq: Once | INTRAVENOUS | Status: DC

## 2022-11-17 NOTE — Progress Notes (Signed)
Called to room to assist during endoscopic procedure.  Patient ID and intended procedure confirmed with present staff. Received instructions for my participation in the procedure from the performing physician.  

## 2022-11-17 NOTE — Op Note (Signed)
[facility] Patient Name: Daniel Velez Procedure Date: 11/17/2022 11:36 AM MRN: [id number] Endoscopist: Beverley Fiedler , MD, [id number] Age: 62 Referring MD:  Date of Birth: [birth year] Gender: Male Account #: [id number] Procedure:                Colonoscopy Indications:              High risk colon cancer surveillance: Personal                            history of multiple adenomas, Last colonoscopy:                            March 2016 (3 TAs all < 1 cm) Medicines:                Monitored Anesthesia Care Procedure:                Pre-Anesthesia Assessment:                           - Prior to the procedure, a History and Physical                            was performed, and patient medications and                            allergies were reviewed. The patient's tolerance of                            previous anesthesia was also reviewed. The risks                            and benefits of the procedure and the sedation                            options and risks were discussed with the patient.                            All questions were answered, and informed consent                            was obtained. Prior Anticoagulants: The patient has                            taken no anticoagulant or antiplatelet agents. ASA                            Grade Assessment: II - A patient with mild systemic                            disease. After reviewing the risks and benefits,                            the patient was deemed in satisfactory condition to  undergo the procedure.                           After obtaining informed consent, the colonoscope                            was passed under direct vision. Throughout the                            procedure, the patient's blood pressure, pulse, and                            oxygen saturations were monitored continuously. The                            CF [id number] #[id number] was introduced  through the anus                            and advanced to the cecum, identified by                            appendiceal orifice and ileocecal valve. The                            colonoscopy was performed without difficulty. The                            patient tolerated the procedure well. The quality                            of the bowel preparation was good. The ileocecal                            valve, appendiceal orifice, and rectum were                            photographed. Scope In: 11:40:33 AM Scope Out: 11:54:02 AM Scope Withdrawal Time: 0 hours 11 minutes 36 seconds  Total Procedure Duration: 0 hours 13 minutes 29 seconds  Findings:                 The digital rectal exam was normal.                           A 2 mm polyp was found in the cecum. The polyp was                            sessile. The polyp was removed with a cold biopsy                            forceps. Resection and retrieval were complete.                           Two sessile polyps were found in the ascending  colon. The polyps were 2 to 3 mm in size. These                            polyps were removed with a cold biopsy forceps.                            Resection and retrieval were complete.                           Internal hemorrhoids were found during                            retroflexion. The hemorrhoids were small.                           The exam was otherwise without abnormality. Complications:            No immediate complications. Estimated Blood Loss:     Estimated blood loss: none. Impression:               - One 2 mm polyp in the cecum, removed with a cold                            biopsy forceps. Resected and retrieved.                           - Two 2 to 3 mm polyps in the ascending colon,                            removed with a cold biopsy forceps. Resected and                            retrieved.                           - Small  internal hemorrhoids.                           - The examination was otherwise normal. Recommendation:           - Patient has a contact number available for                            emergencies. The signs and symptoms of potential                            delayed complications were discussed with the                            patient. Return to normal activities tomorrow.                            Written discharge instructions were provided to the  patient.                           - Resume previous diet.                           - Continue present medications.                           - Await pathology results.                           - Repeat colonoscopy is recommended for                            surveillance. The colonoscopy date will be                            determined after pathology results from today's                            exam become available for review. Beverley Fiedler, MD 11/17/2022 11:56:42 AM This report has been signed electronically.

## 2022-11-17 NOTE — Patient Instructions (Signed)
Resume previous diet and medications. Awaiting pathology results. Repeat Colonoscopy date to be determined based on pathology results.  YOU HAD AN ENDOSCOPIC PROCEDURE TODAY AT THE [facility]:   Refer to the procedure report that was given to you for any specific questions about what was found during the examination.  If the procedure report does not answer your questions, please call your gastroenterologist to clarify.  If you requested that your care partner not be given the details of your procedure findings, then the procedure report has been included in a sealed envelope for you to review at your convenience later.  YOU SHOULD EXPECT: Some feelings of bloating in the abdomen. Passage of more gas than usual.  Walking can help get rid of the air that was put into your GI tract during the procedure and reduce the bloating. If you had a lower endoscopy (such as a colonoscopy or flexible sigmoidoscopy) you may notice spotting of blood in your stool or on the toilet paper. If you underwent a bowel prep for your procedure, you may not have a normal bowel movement for a few days.  Please Note:  You might notice some irritation and congestion in your nose or some drainage.  This is from the oxygen used during your procedure.  There is no need for concern and it should clear up in a day or so.  SYMPTOMS TO REPORT IMMEDIATELY:  Following lower endoscopy (colonoscopy or flexible sigmoidoscopy):  Excessive amounts of blood in the stool  Significant tenderness or worsening of abdominal pains  Swelling of the abdomen that is new, acute  Fever of 100F or higher  For urgent or emergent issues, a gastroenterologist can be reached at any hour by calling [phone]. Do not use MyChart messaging for urgent concerns.    DIET:  We do recommend a small meal at first, but then you may proceed to your regular diet.  Drink plenty of fluids but you should avoid alcoholic beverages for 24  hours.  ACTIVITY:  You should plan to take it easy for the rest of today and you should NOT DRIVE or use heavy machinery until tomorrow (because of the sedation medicines used during the test).    FOLLOW UP: Our staff will call the number listed on your records the next business day following your procedure.  We will call around 7:15- 8:00 am to check on you and address any questions or concerns that you may have regarding the information given to you following your procedure. If we do not reach you, we will leave a message.     If any biopsies were taken you will be contacted by phone or by letter within the next 1-3 weeks.  Please call us at [phone] if you have not heard about the biopsies in 3 weeks.    SIGNATURES/CONFIDENTIALITY: You and/or your care partner have signed paperwork which will be entered into your electronic medical record.  These signatures attest to the fact that that the information above on your After Visit Summary has been reviewed and is understood.  Full responsibility of the confidentiality of this discharge information lies with you and/or your care-partner.  

## 2022-11-17 NOTE — Progress Notes (Signed)
Pt's states no medical or surgical changes since previsit or office visit. 

## 2022-11-17 NOTE — Progress Notes (Signed)
Pt resting comfortably. VSS. Airway intact. SBAR complete to RN. All questions answered.   

## 2022-11-17 NOTE — Progress Notes (Signed)
GASTROENTEROLOGY PROCEDURE H&P NOTE   Primary Care Physician: Alysia Penna, MD    Reason for Procedure:  Personal history of adenomatous colon polyps  Plan:    Colonoscopy  Patient is appropriate for endoscopic procedure(s) in the ambulatory ([facility]) setting.  The nature of the procedure, as well as the risks, benefits, and alternatives were carefully and thoroughly reviewed with the patient. Ample time for discussion and questions allowed. The patient understood, was satisfied, and agreed to proceed.     HPI: Daniel Velez is a 62 y.o. male who presents for surveillance colonoscopy.  Medical history as below.  Tolerated the prep.  No recent chest pain or shortness of breath.  No abdominal pain today.  Past Medical History:  Diagnosis Date   Chronic kidney disease    history of kidney stones   Gallstones    Hypertension     Past Surgical History:  Procedure Laterality Date   CARPAL TUNNEL RELEASE  2004   CERVICAL FUSION  10-2012   HERNIA REPAIR     umbilical hernia   KIDNEY STONE SURGERY  1978, 1983    Prior to Admission medications   Medication Sig Start Date End Date Taking? Authorizing Provider  allopurinol (ZYLOPRIM) 300 MG tablet Take 300 mg by mouth daily. 02/27/22  Yes [provider]  BENICAR HCT 40-25 MG per tablet daily. 04/13/11  Yes [provider]  OVER THE COUNTER MEDICATION daily. Walitin Allergy OTC   Yes [provider]  Colchicine 0.6 MG CAPS Take 0.6 mg by mouth as needed.    [provider]  fluticasone (FLONASE) 50 MCG/ACT nasal spray Place 1 spray into both nostrils 2 (two) times daily as needed. 02/27/22   [provider]  tadalafil (CIALIS) 10 MG tablet Take 10 mg by mouth daily as needed. 02/27/22   [provider]    Current Outpatient Medications  Medication Sig Dispense Refill   allopurinol (ZYLOPRIM) 300 MG tablet Take 300 mg by mouth daily.     BENICAR HCT 40-25 MG per tablet daily.      OVER THE COUNTER MEDICATION daily. Walitin Allergy OTC     Colchicine 0.6 MG CAPS Take 0.6 mg by mouth as needed.     fluticasone (FLONASE) 50 MCG/ACT nasal spray Place 1 spray into both nostrils 2 (two) times daily as needed.     tadalafil (CIALIS) 10 MG tablet Take 10 mg by mouth daily as needed.     Current Facility-Administered Medications  Medication Dose Route Frequency Provider Last Rate Last Admin   0.9 %  sodium chloride infusion  500 mL Intravenous Once Lusero Nordlund, Carie Caddy, MD        Allergies as of 11/17/2022   (No Known Allergies)    Family History  Problem Relation Age of Onset   Cancer Mother        throat   Cancer Maternal Grandmother        pancreatic   Cancer Paternal Grandmother        breast   Colon cancer Neg Hx    Colon polyps Neg Hx    Rectal cancer Neg Hx    Stomach cancer Neg Hx     Social History   Socioeconomic History   Marital status: Married    Spouse name: Not on file   Number of children: Not on file   Years of education: Not on file   Highest education level: Not on file  Occupational History   Not  on file  Tobacco Use   Smoking status: Never   Smokeless tobacco: Never  Vaping Use   Vaping Use: Never used  Substance and Sexual Activity   Alcohol use: Not Currently    Alcohol/week: 1.0 standard drink of alcohol    Types: 1 Standard drinks or equivalent per week   Drug use: No   Sexual activity: Not on file  Other Topics Concern   Not on file  Social History Narrative   Not on file   Social Determinants of Health   Financial Resource Strain: Not on file  Food Insecurity: Not on file  Transportation Needs: Not on file  Physical Activity: Not on file  Stress: Not on file  Social Connections: Not on file  Intimate Partner Violence: Not on file    Physical Exam: Vital signs in last 24 hours: @BP  113/69   Pulse 83   Temp 98 F (36.7 C)   Ht 6' (1.829 m)   Wt 215 lb (97.5 kg)   SpO2 97%   BMI 29.16 kg/m  GEN: NAD EYE:  Sclerae anicteric ENT: MMM CV: Non-tachycardic Pulm: CTA b/l GI: Soft, NT/ND NEURO:  Alert & Oriented x 3   Erick Blinks, MD [facility]  11/17/2022 11:30 AM

## 2022-11-18 ENCOUNTER — Telehealth: Payer: Self-pay | Admitting: *Deleted

## 2022-11-18 NOTE — Telephone Encounter (Signed)
  Follow up Call-    Row Labels 11/17/2022   10:38 AM  Call back number   Section Header. No data exists in this row.   Post procedure Call Back phone  #   [phone]  Permission to leave phone message   Yes     Patient questions:  Do you have a fever, pain , or abdominal swelling? No. Pain Score  0 *  Have you tolerated food without any problems? Yes.    Have you been able to return to your normal activities? Yes.    Do you have any questions about your discharge instructions: Diet   No. Medications  No. Follow up visit  No.  Do you have questions or concerns about your Care? No.  Actions: * If pain score is 4 or above: No action needed, pain <4.

## 2022-11-21 ENCOUNTER — Telehealth: Payer: Self-pay | Admitting: Internal Medicine

## 2022-11-21 NOTE — Telephone Encounter (Signed)
The pt has been advised that Dr Rhea Belton has not yet reviewed the path.  He will be contacted as soon as able.

## 2022-11-21 NOTE — Telephone Encounter (Signed)
PT is calling to go over procedure results. Please advise.

## 2022-11-26 ENCOUNTER — Encounter: Payer: Self-pay | Admitting: Internal Medicine

## 2022-11-27 NOTE — Telephone Encounter (Signed)
Inbound call from patient wishing a call back to discuss colonoscopy results. States that he received a notification in Mychart but could not see what it said. Please advise, thank you.

## 2022-11-28 NOTE — Telephone Encounter (Signed)
I will forward to Dr Lauro Franklin nurse

## 2022-11-28 NOTE — Telephone Encounter (Signed)
Spoke with pt and discussed path letter with him and he is aware of results and recommendations per Dr. Rhea Belton.

## 2022-12-08 ENCOUNTER — Encounter: Payer: BC Managed Care – PPO | Admitting: Internal Medicine

## 2024-03-12 ENCOUNTER — Other Ambulatory Visit: Payer: Self-pay

## 2024-03-12 ENCOUNTER — Encounter (HOSPITAL_BASED_OUTPATIENT_CLINIC_OR_DEPARTMENT_OTHER): Payer: Self-pay

## 2024-03-12 ENCOUNTER — Inpatient Hospital Stay (HOSPITAL_BASED_OUTPATIENT_CLINIC_OR_DEPARTMENT_OTHER)
Admission: EM | Admit: 2024-03-12 | Discharge: 2024-03-16 | DRG: 871 | Disposition: A | Discharge status: Home / Self Care | Attending: Internal Medicine | Admitting: Internal Medicine

## 2024-03-12 DIAGNOSIS — D509 Iron deficiency anemia, unspecified: Secondary | ICD-10-CM | POA: Diagnosis present

## 2024-03-12 DIAGNOSIS — Z6830 Body mass index (BMI) 30.0-30.9, adult: Secondary | ICD-10-CM

## 2024-03-12 DIAGNOSIS — M6282 Rhabdomyolysis: Secondary | ICD-10-CM | POA: Diagnosis present

## 2024-03-12 DIAGNOSIS — I10 Essential (primary) hypertension: Secondary | ICD-10-CM | POA: Diagnosis present

## 2024-03-12 DIAGNOSIS — Z1152 Encounter for screening for COVID-19: Secondary | ICD-10-CM

## 2024-03-12 DIAGNOSIS — N182 Chronic kidney disease, stage 2 (mild): Secondary | ICD-10-CM | POA: Diagnosis present

## 2024-03-12 DIAGNOSIS — A419 Sepsis, unspecified organism: Secondary | ICD-10-CM | POA: Diagnosis not present

## 2024-03-12 DIAGNOSIS — R9431 Abnormal electrocardiogram [ECG] [EKG]: Secondary | ICD-10-CM | POA: Diagnosis present

## 2024-03-12 DIAGNOSIS — E876 Hypokalemia: Secondary | ICD-10-CM | POA: Diagnosis not present

## 2024-03-12 DIAGNOSIS — N400 Enlarged prostate without lower urinary tract symptoms: Secondary | ICD-10-CM | POA: Diagnosis present

## 2024-03-12 DIAGNOSIS — E872 Acidosis, unspecified: Secondary | ICD-10-CM | POA: Diagnosis present

## 2024-03-12 DIAGNOSIS — N136 Pyonephrosis: Secondary | ICD-10-CM | POA: Diagnosis present

## 2024-03-12 DIAGNOSIS — N179 Acute kidney failure, unspecified: Secondary | ICD-10-CM | POA: Diagnosis present

## 2024-03-12 DIAGNOSIS — N390 Urinary tract infection, site not specified: Secondary | ICD-10-CM

## 2024-03-12 DIAGNOSIS — J189 Pneumonia, unspecified organism: Secondary | ICD-10-CM

## 2024-03-12 DIAGNOSIS — E871 Hypo-osmolality and hyponatremia: Principal | ICD-10-CM | POA: Diagnosis present

## 2024-03-12 DIAGNOSIS — E8729 Other acidosis: Secondary | ICD-10-CM

## 2024-03-12 DIAGNOSIS — E860 Dehydration: Secondary | ICD-10-CM | POA: Diagnosis present

## 2024-03-12 DIAGNOSIS — M109 Gout, unspecified: Secondary | ICD-10-CM | POA: Diagnosis present

## 2024-03-12 DIAGNOSIS — R066 Hiccough: Secondary | ICD-10-CM | POA: Diagnosis present

## 2024-03-12 DIAGNOSIS — E66811 Obesity, class 1: Secondary | ICD-10-CM | POA: Diagnosis present

## 2024-03-12 DIAGNOSIS — E861 Hypovolemia: Secondary | ICD-10-CM | POA: Diagnosis present

## 2024-03-12 DIAGNOSIS — Z79899 Other long term (current) drug therapy: Secondary | ICD-10-CM

## 2024-03-12 DIAGNOSIS — R6510 Systemic inflammatory response syndrome (SIRS) of non-infectious origin without acute organ dysfunction: Secondary | ICD-10-CM

## 2024-03-12 DIAGNOSIS — Z87442 Personal history of urinary calculi: Secondary | ICD-10-CM

## 2024-03-12 DIAGNOSIS — A481 Legionnaires' disease: Secondary | ICD-10-CM | POA: Diagnosis present

## 2024-03-12 DIAGNOSIS — N2889 Other specified disorders of kidney and ureter: Secondary | ICD-10-CM | POA: Diagnosis present

## 2024-03-12 NOTE — ED Triage Notes (Signed)
 Pt has had hiccups x 2-3 days.

## 2024-03-13 ENCOUNTER — Emergency Department (HOSPITAL_BASED_OUTPATIENT_CLINIC_OR_DEPARTMENT_OTHER)

## 2024-03-13 ENCOUNTER — Inpatient Hospital Stay (HOSPITAL_COMMUNITY)

## 2024-03-13 DIAGNOSIS — J189 Pneumonia, unspecified organism: Secondary | ICD-10-CM

## 2024-03-13 DIAGNOSIS — N179 Acute kidney failure, unspecified: Secondary | ICD-10-CM | POA: Diagnosis present

## 2024-03-13 DIAGNOSIS — E860 Dehydration: Secondary | ICD-10-CM | POA: Diagnosis present

## 2024-03-13 DIAGNOSIS — N2889 Other specified disorders of kidney and ureter: Secondary | ICD-10-CM | POA: Diagnosis present

## 2024-03-13 DIAGNOSIS — Z79899 Other long term (current) drug therapy: Secondary | ICD-10-CM | POA: Diagnosis not present

## 2024-03-13 DIAGNOSIS — M109 Gout, unspecified: Secondary | ICD-10-CM | POA: Diagnosis present

## 2024-03-13 DIAGNOSIS — R609 Edema, unspecified: Secondary | ICD-10-CM | POA: Diagnosis not present

## 2024-03-13 DIAGNOSIS — M6282 Rhabdomyolysis: Secondary | ICD-10-CM | POA: Diagnosis present

## 2024-03-13 DIAGNOSIS — D509 Iron deficiency anemia, unspecified: Secondary | ICD-10-CM | POA: Diagnosis present

## 2024-03-13 DIAGNOSIS — Z1152 Encounter for screening for COVID-19: Secondary | ICD-10-CM | POA: Diagnosis not present

## 2024-03-13 DIAGNOSIS — A481 Legionnaires' disease: Secondary | ICD-10-CM

## 2024-03-13 DIAGNOSIS — E876 Hypokalemia: Secondary | ICD-10-CM | POA: Diagnosis present

## 2024-03-13 DIAGNOSIS — N182 Chronic kidney disease, stage 2 (mild): Secondary | ICD-10-CM | POA: Diagnosis present

## 2024-03-13 DIAGNOSIS — E861 Hypovolemia: Secondary | ICD-10-CM | POA: Diagnosis present

## 2024-03-13 DIAGNOSIS — R008 Other abnormalities of heart beat: Secondary | ICD-10-CM | POA: Diagnosis not present

## 2024-03-13 DIAGNOSIS — R066 Hiccough: Secondary | ICD-10-CM | POA: Diagnosis present

## 2024-03-13 DIAGNOSIS — N136 Pyonephrosis: Secondary | ICD-10-CM | POA: Diagnosis present

## 2024-03-13 DIAGNOSIS — N400 Enlarged prostate without lower urinary tract symptoms: Secondary | ICD-10-CM | POA: Diagnosis present

## 2024-03-13 DIAGNOSIS — E66811 Obesity, class 1: Secondary | ICD-10-CM | POA: Diagnosis present

## 2024-03-13 DIAGNOSIS — E872 Acidosis, unspecified: Secondary | ICD-10-CM | POA: Diagnosis present

## 2024-03-13 DIAGNOSIS — Z6830 Body mass index (BMI) 30.0-30.9, adult: Secondary | ICD-10-CM | POA: Diagnosis not present

## 2024-03-13 DIAGNOSIS — A419 Sepsis, unspecified organism: Secondary | ICD-10-CM | POA: Diagnosis present

## 2024-03-13 DIAGNOSIS — R9431 Abnormal electrocardiogram [ECG] [EKG]: Secondary | ICD-10-CM | POA: Diagnosis present

## 2024-03-13 DIAGNOSIS — E871 Hypo-osmolality and hyponatremia: Secondary | ICD-10-CM | POA: Diagnosis present

## 2024-03-13 DIAGNOSIS — Z87442 Personal history of urinary calculi: Secondary | ICD-10-CM | POA: Diagnosis not present

## 2024-03-13 DIAGNOSIS — I10 Essential (primary) hypertension: Secondary | ICD-10-CM | POA: Diagnosis present

## 2024-03-13 LAB — RESP PANEL BY RT-PCR (RSV, FLU A&B, COVID)  RVPGX2
Influenza A by PCR: NEGATIVE
Influenza B by PCR: NEGATIVE
Resp Syncytial Virus by PCR: NEGATIVE
SARS Coronavirus 2 by RT PCR: NEGATIVE

## 2024-03-13 LAB — URINALYSIS, W/ REFLEX TO CULTURE (INFECTION SUSPECTED)
Bilirubin Urine: NEGATIVE
Glucose, UA: NEGATIVE mg/dL
Ketones, ur: NEGATIVE mg/dL
Leukocytes,Ua: NEGATIVE
Nitrite: NEGATIVE
Protein, ur: 30 mg/dL — AB
Specific Gravity, Urine: 1.016 (ref 1.005–1.030)
pH: 5.5 (ref 5.0–8.0)

## 2024-03-13 LAB — RESPIRATORY PANEL BY PCR

## 2024-03-13 LAB — CBC WITH DIFFERENTIAL/PLATELET
Abs Immature Granulocytes: 0.09 K/uL — ABNORMAL HIGH (ref 0.00–0.07)
Basophils Absolute: 0 K/uL (ref 0.0–0.1)
Basophils Relative: 0 %
Eosinophils Absolute: 0 K/uL (ref 0.0–0.5)
Eosinophils Relative: 0 %
HCT: 28.3 % — ABNORMAL LOW (ref 39.0–52.0)
Hemoglobin: 10.1 g/dL — ABNORMAL LOW (ref 13.0–17.0)
Immature Granulocytes: 1 %
Lymphocytes Relative: 13 %
Lymphs Abs: 2 K/uL (ref 0.7–4.0)
MCH: 30.1 pg (ref 26.0–34.0)
MCHC: 35.7 g/dL (ref 30.0–36.0)
MCV: 84.2 fL (ref 80.0–100.0)
Monocytes Absolute: 2.1 K/uL — ABNORMAL HIGH (ref 0.1–1.0)
Monocytes Relative: 14 %
Neutro Abs: 10.6 K/uL — ABNORMAL HIGH (ref 1.7–7.7)
Neutrophils Relative %: 72 %
Platelets: 278 K/uL (ref 150–400)
RBC: 3.36 MIL/uL — ABNORMAL LOW (ref 4.22–5.81)
RDW: 13 % (ref 11.5–15.5)
WBC: 14.7 K/uL — ABNORMAL HIGH (ref 4.0–10.5)
nRBC: 0 % (ref 0.0–0.2)

## 2024-03-13 LAB — HEPATIC FUNCTION PANEL
ALT: 53 U/L — ABNORMAL HIGH (ref 0–44)
AST: 121 U/L — ABNORMAL HIGH (ref 15–41)
Albumin: 2.5 g/dL — ABNORMAL LOW (ref 3.5–5.0)
Alkaline Phosphatase: 49 U/L (ref 38–126)
Bilirubin, Direct: 1.6 mg/dL — ABNORMAL HIGH (ref 0.0–0.2)
Indirect Bilirubin: 1.5 mg/dL — ABNORMAL HIGH (ref 0.3–0.9)
Total Bilirubin: 3.1 mg/dL — ABNORMAL HIGH (ref 0.0–1.2)
Total Protein: 5.9 g/dL — ABNORMAL LOW (ref 6.5–8.1)

## 2024-03-13 LAB — BASIC METABOLIC PANEL WITH GFR
Anion gap: 13 (ref 5–15)
Anion gap: 11 (ref 5–15)
BUN: 22 mg/dL (ref 8–23)
BUN: 22 mg/dL (ref 8–23)
CO2: 18 mmol/L — ABNORMAL LOW (ref 22–32)
CO2: 19 mmol/L — ABNORMAL LOW (ref 22–32)
Calcium: 8.3 mg/dL — ABNORMAL LOW (ref 8.9–10.3)
Calcium: 8.3 mg/dL — ABNORMAL LOW (ref 8.9–10.3)
Chloride: 92 mmol/L — ABNORMAL LOW (ref 98–111)
Chloride: 94 mmol/L — ABNORMAL LOW (ref 98–111)
Creatinine, Ser: 1.43 mg/dL — ABNORMAL HIGH (ref 0.61–1.24)
Creatinine, Ser: 1.45 mg/dL — ABNORMAL HIGH (ref 0.61–1.24)
GFR, Estimated: 56 mL/min — ABNORMAL LOW (ref >60)
GFR, Estimated: 55 mL/min — ABNORMAL LOW (ref >60)
Glucose, Bld: 105 mg/dL — ABNORMAL HIGH (ref 70–99)
Glucose, Bld: 141 mg/dL — ABNORMAL HIGH (ref 70–99)
Potassium: 3.3 mmol/L — ABNORMAL LOW (ref 3.5–5.1)
Potassium: 3.1 mmol/L — ABNORMAL LOW (ref 3.5–5.1)
Sodium: 123 mmol/L — ABNORMAL LOW (ref 135–145)
Sodium: 124 mmol/L — ABNORMAL LOW (ref 135–145)

## 2024-03-13 LAB — COMPREHENSIVE METABOLIC PANEL WITH GFR
ALT: 59 U/L — ABNORMAL HIGH (ref 0–44)
AST: 119 U/L — ABNORMAL HIGH (ref 15–41)
Albumin: 3.9 g/dL (ref 3.5–5.0)
Alkaline Phosphatase: 74 U/L (ref 38–126)
Anion gap: 17 — ABNORMAL HIGH (ref 5–15)
BUN: 28 mg/dL — ABNORMAL HIGH (ref 8–23)
CO2: 18 mmol/L — ABNORMAL LOW (ref 22–32)
Calcium: 8.6 mg/dL — ABNORMAL LOW (ref 8.9–10.3)
Chloride: 87 mmol/L — ABNORMAL LOW (ref 98–111)
Creatinine, Ser: 1.53 mg/dL — ABNORMAL HIGH (ref 0.61–1.24)
GFR, Estimated: 51 mL/min — ABNORMAL LOW (ref >60)
Glucose, Bld: 123 mg/dL — ABNORMAL HIGH (ref 70–99)
Potassium: 2.9 mmol/L — ABNORMAL LOW (ref 3.5–5.1)
Sodium: 121 mmol/L — ABNORMAL LOW (ref 135–145)
Total Bilirubin: 4 mg/dL — ABNORMAL HIGH (ref 0.0–1.2)
Total Protein: 7.4 g/dL (ref 6.5–8.1)

## 2024-03-13 LAB — LACTIC ACID, PLASMA: Lactic Acid, Venous: 1.7 mmol/L (ref 0.5–1.9)

## 2024-03-13 LAB — SODIUM, URINE, RANDOM: Sodium, Ur: 27 mmol/L

## 2024-03-13 LAB — OSMOLALITY: Osmolality: 272 mosm/kg — ABNORMAL LOW (ref 275–295)

## 2024-03-13 LAB — CK
Total CK: 4152 U/L — ABNORMAL HIGH (ref 49–397)
Total CK: 4766 U/L — ABNORMAL HIGH (ref 49–397)

## 2024-03-13 LAB — C-REACTIVE PROTEIN: CRP: 24.4 mg/dL — ABNORMAL HIGH (ref <1.0)

## 2024-03-13 LAB — PROTIME-INR
INR: 1.1 (ref 0.8–1.2)
Prothrombin Time: 14.5 s (ref 11.4–15.2)

## 2024-03-13 LAB — MAGNESIUM
Magnesium: 1.7 mg/dL (ref 1.7–2.4)
Magnesium: 1.6 mg/dL — ABNORMAL LOW (ref 1.7–2.4)

## 2024-03-13 LAB — HIV ANTIBODY (ROUTINE TESTING W REFLEX): HIV Screen 4th Generation wRfx: NONREACTIVE

## 2024-03-13 LAB — STREP PNEUMONIAE URINARY ANTIGEN: Strep Pneumo Urinary Antigen: NEGATIVE

## 2024-03-13 LAB — PROCALCITONIN: Procalcitonin: 2.44 ng/mL

## 2024-03-13 LAB — OSMOLALITY, URINE: Osmolality, Ur: 469 mosm/kg (ref 300–900)

## 2024-03-13 LAB — MRSA NEXT GEN BY PCR, NASAL: MRSA by PCR Next Gen: NOT DETECTED

## 2024-03-13 MED ORDER — POTASSIUM CHLORIDE 10 MEQ/100ML IV SOLN
10.0000 meq | INTRAVENOUS | Status: AC
  Administered 2024-03-13 (×3): 10 meq via INTRAVENOUS
  Filled 2024-03-13 (×3): qty 100

## 2024-03-13 MED ORDER — HYDROCODONE BIT-HOMATROP MBR 5-1.5 MG/5ML PO SOLN
5.0000 mL | ORAL | Status: DC | PRN
  Administered 2024-03-13 (×2): 5 mL via ORAL
  Filled 2024-03-13 (×2): qty 5

## 2024-03-13 MED ORDER — ACETAMINOPHEN 650 MG RE SUPP: 650.0000 mg | Freq: Four times a day (QID) | RECTAL | Status: DC | PRN

## 2024-03-13 MED ORDER — SODIUM CHLORIDE 0.9 % IV SOLN
2.0000 g | Freq: Once | INTRAVENOUS | Status: AC
  Administered 2024-03-13: 2 g via INTRAVENOUS
  Filled 2024-03-13: qty 12.5

## 2024-03-13 MED ORDER — SODIUM CHLORIDE 0.9% FLUSH
3.0000 mL | Freq: Two times a day (BID) | INTRAVENOUS | Status: DC
  Administered 2024-03-13 – 2024-03-16 (×5): 3 mL via INTRAVENOUS

## 2024-03-13 MED ORDER — POTASSIUM CHLORIDE CRYS ER 20 MEQ PO TBCR
40.0000 meq | EXTENDED_RELEASE_TABLET | Freq: Once | ORAL | Status: AC
  Administered 2024-03-13: 40 meq via ORAL
  Filled 2024-03-13: qty 2

## 2024-03-13 MED ORDER — ENOXAPARIN SODIUM 40 MG/0.4ML IJ SOSY
40.0000 mg | PREFILLED_SYRINGE | INTRAMUSCULAR | Status: DC
  Administered 2024-03-13 – 2024-03-16 (×4): 40 mg via SUBCUTANEOUS
  Filled 2024-03-13 (×4): qty 0.4

## 2024-03-13 MED ORDER — AZITHROMYCIN 500 MG PO TABS
500.0000 mg | ORAL_TABLET | Freq: Every day | ORAL | Status: DC
  Administered 2024-03-13 – 2024-03-16 (×4): 500 mg via ORAL
  Filled 2024-03-13 (×4): qty 1

## 2024-03-13 MED ORDER — VANCOMYCIN HCL IN DEXTROSE 1-5 GM/200ML-% IV SOLN
1000.0000 mg | Freq: Once | INTRAVENOUS | Status: AC
  Administered 2024-03-13: 1000 mg via INTRAVENOUS
  Filled 2024-03-13: qty 200

## 2024-03-13 MED ORDER — IOHEXOL 300 MG/ML  SOLN
100.0000 mL | Freq: Once | INTRAMUSCULAR | Status: AC | PRN
  Administered 2024-03-13: 100 mL via INTRAVENOUS

## 2024-03-13 MED ORDER — SODIUM CHLORIDE 0.9 % IV SOLN
2.0000 g | INTRAVENOUS | Status: DC
  Administered 2024-03-13 – 2024-03-16 (×4): 2 g via INTRAVENOUS
  Filled 2024-03-13 (×4): qty 20

## 2024-03-13 MED ORDER — LACTATED RINGERS IV SOLN: INTRAVENOUS | Status: AC

## 2024-03-13 MED ORDER — ONDANSETRON HCL 4 MG PO TABS: 4.0000 mg | ORAL_TABLET | Freq: Four times a day (QID) | ORAL | Status: DC | PRN

## 2024-03-13 MED ORDER — VANCOMYCIN HCL IN DEXTROSE 1-5 GM/200ML-% IV SOLN: 1000.0000 mg | Freq: Once | INTRAVENOUS | Status: DC

## 2024-03-13 MED ORDER — POTASSIUM CHLORIDE CRYS ER 20 MEQ PO TBCR
40.0000 meq | EXTENDED_RELEASE_TABLET | ORAL | Status: AC
  Administered 2024-03-13 – 2024-03-14 (×2): 40 meq via ORAL
  Filled 2024-03-13 (×2): qty 2

## 2024-03-13 MED ORDER — POTASSIUM CHLORIDE CRYS ER 20 MEQ PO TBCR
40.0000 meq | EXTENDED_RELEASE_TABLET | Freq: Three times a day (TID) | ORAL | Status: AC
  Administered 2024-03-13 (×2): 40 meq via ORAL
  Filled 2024-03-13 (×2): qty 2

## 2024-03-13 MED ORDER — METRONIDAZOLE 500 MG/100ML IV SOLN
500.0000 mg | Freq: Once | INTRAVENOUS | Status: AC
  Administered 2024-03-13: 500 mg via INTRAVENOUS
  Filled 2024-03-13: qty 100

## 2024-03-13 MED ORDER — ACETAMINOPHEN 325 MG PO TABS
650.0000 mg | ORAL_TABLET | Freq: Once | ORAL | Status: AC
  Administered 2024-03-13: 650 mg via ORAL
  Filled 2024-03-13: qty 2

## 2024-03-13 MED ORDER — ACETAMINOPHEN 325 MG PO TABS
650.0000 mg | ORAL_TABLET | Freq: Four times a day (QID) | ORAL | Status: DC | PRN
Start: 2024-03-13 — End: 2024-03-14
  Administered 2024-03-13 – 2024-03-14 (×3): 650 mg via ORAL
  Filled 2024-03-13 (×3): qty 2

## 2024-03-13 MED ORDER — ONDANSETRON HCL 4 MG/2ML IJ SOLN: 4.0000 mg | Freq: Four times a day (QID) | INTRAMUSCULAR | Status: DC | PRN

## 2024-03-13 MED ORDER — GUAIFENESIN-DM 100-10 MG/5ML PO SYRP
5.0000 mL | ORAL_SOLUTION | ORAL | Status: DC | PRN
  Administered 2024-03-13: 5 mL via ORAL
  Filled 2024-03-13: qty 5

## 2024-03-13 MED ORDER — LACTATED RINGERS IV BOLUS (SEPSIS)
1000.0000 mL | Freq: Once | INTRAVENOUS | Status: AC
  Administered 2024-03-13: 1000 mL via INTRAVENOUS

## 2024-03-13 MED ORDER — GUAIFENESIN 100 MG/5ML PO LIQD
15.0000 mL | Freq: Two times a day (BID) | ORAL | Status: DC
  Administered 2024-03-13 – 2024-03-16 (×7): 15 mL via ORAL
  Filled 2024-03-13 (×3): qty 20
  Filled 2024-03-13: qty 15
  Filled 2024-03-13: qty 20
  Filled 2024-03-13: qty 15
  Filled 2024-03-13: qty 20

## 2024-03-13 NOTE — Plan of Care (Signed)
 Plan of Care Note for accepted transfer   Patient: Daniel Velez MRN: [id number]   DOA: 03/12/2024  Facility requesting transfer: [facility] Requesting Provider: Dr. Haze Reason for transfer: hyponatremia, AKI, fever, ?pyelo Facility course:  64 yo male with PMH CKD (u/k baseline/stage), HTN, BPH who presented with ongoing hiccups. He had reported getting a recent steroid shot and cough syrup from his doctor too.  On workup he was found to be febrile 101.1 with some tachycardia, 109.  Oral intake had also been poor due to the hiccups.  Multiple lab abnormalities found as well: UA: neg nitrite, neg LE; large Hgb, 0-5 RBC WBC 14.7, 1% bands Na 121, K 2.9, CO2 18, BUN 28, Creat 1.53, AG 17 Lactic 1.7 AST 119, ALT 59, ALP 74, TB 4  CT A/P: multiple small hepatic cysts and B/L simple renal cysts, cholelithiasis, no GB pathology or ductal dilitation; mild/mod left hydronephrosis and mild right perinephric stranding.  Due to concern for sepsis/pyelo, he was started on vanc, cefepime , flagyl .   CK was also recommended given UA findings and worsened renal function.     Plan of care: The patient is accepted for admission to Telemetry unit, at [facility]  Author: Alm Apo, MD 03/13/2024  Check [email or web address] for on-call coverage.  Nursing staff, Please call [facility] Admits & Consults System-Wide number on Amion as soon as patient's arrival, so appropriate admitting provider can evaluate the pt.

## 2024-03-13 NOTE — ED Notes (Signed)
-  Called care

## 2024-03-13 NOTE — Progress Notes (Signed)
 Pt being followed by [facility] for Sepsis protocol.

## 2024-03-13 NOTE — H&P (Signed)
 History and Physical    Patient: Daniel Velez FMW:[id number] DOB: 21-Apr-1961 DOA: 03/12/2024 DOS: the patient was seen and examined on 03/13/2024 PCP: Larnell Hamilton, MD  Patient coming from: Home  Chief Complaint:  Chief Complaint  Patient presents with   Hiccups   HPI: Daniel Velez is a 63 y.o. male with medical history significant of hypertension, mild chronic kidney disease, erectile dysfunction and remote history of renal calculi 44 years prior.  Patient reports a 1 week history of nonproductive coughing.  Went to his PCP for further evaluation.  Was COVID-negative and was given IM injection of steroids and symptom management medications consisting of cough suppressant.  Was not febrile at that time.  Patient states he did not feel like he was running fevers at home but also did not check.  Unfortunately cough has persisted and he initially presented to the ED apparently complaining of hiccups for 2 to 3 days.  He reported today EDP that he felt dehydrated and tired and worsening symptoms.  In the ED he had a temperature of 101.1 with mild tachycardia.  Poor oral intake.  Urinalysis was abnormal and concerning for UTI.  CK elevated greater than 4000.  Mild transaminitis.  Sodium low at 121, potassium low at 2.9, white count elevated at 14.7, BUN 28 creatinine 1.53 with an elevated anion gap of 17.  Normal lactic acid.  CT abdomen and pelvis revealed right pyelonephritis as well as mild to moderate left hydronephrosis.  He was also found to have an enlarged prostate gland.  Hospitalist service was consulted to evaluate the patient for admission.  Upon our evaluation of the patient he was still having issues with coughing which was nonproductive.  ED did repeat PCR testing and he was once again negative for COVID as well as for RSV and influenza.  Due to concerns over possible sepsis EDP gave this patient IV Maxipime , Flagyl  and vancomycin .  He has received 1 L of IV fluids in the ED.  After  fluids were administered urine osmolality was ordered by this team and was found to be low at 272.  Follow-up CPK is pending.  After treatment with fluids and both IV and oral potassium patient's potassium has increased to 3.3, sodium has increased to 123, BUN has decreased to 22 and creatinine has decreased to 1.43.  Blood cultures have been obtained in the ED.  Patient reported to this writer that he has had dark urine for the past 3 to 4 days.  Review of Systems: As mentioned in the history of present illness. All other systems reviewed and are negative.   Past Medical History:  Diagnosis Date   Chronic kidney disease    history of kidney stones   Gallstones    Hypertension    Past Surgical History:  Procedure Laterality Date   CARPAL TUNNEL RELEASE  2004   CERVICAL FUSION  10-2012   HERNIA REPAIR     umbilical hernia   KIDNEY STONE SURGERY  1978, 1983   Social History:  reports that he has never smoked. He has never used smokeless tobacco. He reports that he does not currently use alcohol  after a past usage of about 1.0 standard drink of alcohol  per week. He reports that he does not use drugs.  No Known Allergies  Family History  Problem Relation Age of Onset   Cancer Mother        throat   Cancer Maternal Grandmother  pancreatic   Cancer Paternal Grandmother        breast   Colon cancer Neg Hx    Colon polyps Neg Hx    Rectal cancer Neg Hx    Stomach cancer Neg Hx     Prior to Admission medications   Medication Sig Start Date End Date Taking? Authorizing Provider  allopurinol  (ZYLOPRIM ) 300 MG tablet Take 300 mg by mouth daily. 02/27/22   [provider]  BENICAR HCT 40-25 MG per tablet daily. 04/13/11   [provider]  Colchicine 0.6 MG CAPS Take 0.6 mg by mouth as needed.    [provider]  fluticasone (FLONASE) 50 MCG/ACT nasal spray Place 1 spray into both nostrils 2 (two) times daily as needed. 02/27/22   [provider]   OVER THE COUNTER MEDICATION daily. Walitin Allergy OTC    [provider]  tadalafil (CIALIS) 10 MG tablet Take 10 mg by mouth daily as needed. 02/27/22   [provider]    Physical Exam: Vitals:   03/13/24 0530 03/13/24 0728 03/13/24 0946 03/13/24 1029  BP: 127/81 115/64  126/71  Pulse: 96 (!) 107    Resp: 20 (!) 21 (!) 21   Temp: 98.6 F (37 C) 99.6 F (37.6 C)  99.4 F (37.4 C)  TempSrc: Oral Oral  Oral  SpO2: 98%     Weight:      Height:       Constitutional: NAD, calm, comfortable Respiratory: Post with scattered crackles primarily in the mid fields.. Normal respiratory effort. No accessory muscle use.  Cardiovascular: Regular rate and rhythm, no murmurs / rubs / gallops. No extremity edema. 2+ pedal pulses. No carotid bruits.  Abdomen: no tenderness, no masses palpated. No hepatosplenomegaly. Bowel sounds positive.  Musculoskeletal: no clubbing / cyanosis. No joint deformity upper and lower extremities. Good ROM, no contractures. Normal muscle tone.  Skin: no rashes, lesions, ulcers. No induration Neurologic: CN 2-12 grossly intact. Sensation intact, DTR normal. Strength 5/5 x all 4 extremities.  Psychiatric: Normal judgment and insight. Alert and oriented x 3. Normal mood.     Data Reviewed:  Initial sodium 141, potassium 2.9, chloride 87, CO2 18, glucose 123, BUN 28, creatinine 1.53, anion gap 17, AST 119, ALT 59, total bilirubin 4.0 Follow-up labs reveal a sodium of 123, potassium 3.3, chloride 92, CO2 18, glucose 105, BUN 22, creatinine 1.43, anion gap 13  CK 4152-repeat CK pending  Lactic acid 1.7  Osmolality 272; urine osmolality and urine sodium pending  Initial white count 14,700 with normal neutrophils although absolute neutrophils are elevated at 10.6  PCR for COVID, RSV and influenza negative  Urinalysis abnormal with hazy appearance, orange color, large hemoglobin, 30 of protein, rare bacteria, WBC 6-10  Blood cultures are  pending  CT abdomen and pelvis as above  Chest x-ray: Moderate to marked severe right upper lobe infiltrate.  Follow-up resolution is recommended to exclude possible underlying neoplastic process.    Assessment and Plan: Community-acquired pneumonia No hypoxemia but pneumonia documented as moderate to marked severity right upper lobe infiltrate To better characterize this area we will obtain a noncontrasted CT of the chest-currently has AKI and cannot use contrast Check urine culture and follow-up on blood culture Begin Rocephin  and Zithromax  IV Supportive care with mucolytic's and cough suppressant agents noting patient has been having hiccups which was his primary complaint upon presentation Incentive spirometry Adequate hydration both oral and IV  Left hydronephrosis and right pyelonephritis UA abnormal but actually  appears more consistent with dehydration than UTI Patient has fever and leukocytosis which can also be explained by pneumonia process Creatinine has improved slightly with hydration Does have a remote history of renal calculi 44 years prior Urology has been consulted and will see patient later today Follow intake and output  Acute hyponatremia likely hypovolemic  History consistent with hypovolemic hyponatremia Serum osmolality slightly low and this was obtained after receiving 1 L of lactated Ringer  Urine sodium and osmolality pending Continue lactated Ringer 's at 125 cc/h Follow electrolytes noting sodium has increased slightly with hydration Hold home thiazide diuretic which is also contributing to hyponatremia  Acute kidney injury Baseline creatinine unknown-I did search care everywhere and was unable to find electrolyte panel under labs Possible history of mild CKD based on problem list Continue to follow labs and avoid nephrotoxic medication-ARB on hold  Acute rhabdomyolysis Secondary to dehydration No apparent preadmission medications that would cause  rhabdomyolysis Associated mild acute kidney injury Patient reported dark brown urine prior to admission Continue to follow CKD and renal function  Acute hypokalemia Potassium has improved slightly with administration of oral and IV replacement Will give additional oral replacement 40 meq every 8 hours x 2 doses and repeat labs in the morning  Hypertension Blood pressures currently suboptimal secondary to SIRS physiology as well as volume depletion Also holding ARB secondary to AKI and hydrochlorothiazide secondary to hyponatremia  BPH Patient is asymptomatic Last PSA was March 2025 with PCP   Advance Care Planning:   Code Status: Full Code   VTE prophylaxis: Lovenox   Consults: Urology  Family Communication: Patient only    Severity of Illness: The appropriate patient status for this patient is INPATIENT. Inpatient status is judged to be reasonable and necessary in order to provide the required intensity of service to ensure the patient's safety. The patient's presenting symptoms, physical exam findings, and initial radiographic and laboratory data in the context of their chronic comorbidities is felt to place them at high risk for further clinical deterioration. Furthermore, it is not anticipated that the patient will be medically stable for discharge from the hospital within 2 midnights of admission.   * I certify that at the point of admission it is my clinical judgment that the patient will require inpatient hospital care spanning beyond 2 midnights from the point of admission due to high intensity of service, high risk for further deterioration and high frequency of surveillance required.*  Author: Isaiah Lever, NP 03/13/2024 10:57 AM  For on call review [email or web address].

## 2024-03-13 NOTE — ED Notes (Signed)
-  Called [facility] at 418am for transportation to [facility].

## 2024-03-13 NOTE — Plan of Care (Signed)
  Problem: Education: Goal: Knowledge of General Education information will improve Description: Including pain rating scale, medication(s)/side effects and non-pharmacologic comfort measures Outcome: Progressing   Problem: Health Behavior/Discharge Planning: Goal: Ability to manage health-related needs will improve Outcome: Progressing   Problem: Activity: Goal: Risk for activity intolerance will decrease Outcome: Progressing   Problem: Nutrition: Goal: Adequate nutrition will be maintained Outcome: Progressing   Problem: Coping: Goal: Level of anxiety will decrease Outcome: Progressing   Problem: Pain Managment: Goal: General experience of comfort will improve and/or be controlled Outcome: Progressing

## 2024-03-13 NOTE — ED Provider Notes (Addendum)
 [place] EMERGENCY DEPARTMENT AT [facility] Provider Note   CSN: [id number] Arrival date & time: 03/12/24  2343     Patient presents with: Hiccups   Daniel Velez is a 63 y.o. male.   Patient presents to the emergency department for evaluation of hiccups.  He reports that he has been experiencing hiccups on and off for the last couple of days.  He has not felt well, saw his primary doctor.  He reports that he felt dehydrated and tired.  He was given a shot of steroid and a cough syrup by his doctor.  He has felt worse since that time.       Prior to Admission medications   Medication Sig Start Date End Date Taking? Authorizing Provider  allopurinol  (ZYLOPRIM ) 300 MG tablet Take 300 mg by mouth daily. 02/27/22   [provider]  BENICAR HCT 40-25 MG per tablet daily. 04/13/11   [provider]  Colchicine 0.6 MG CAPS Take 0.6 mg by mouth as needed.    [provider]  fluticasone (FLONASE) 50 MCG/ACT nasal spray Place 1 spray into both nostrils 2 (two) times daily as needed. 02/27/22   [provider]  OVER THE COUNTER MEDICATION daily. Walitin Allergy OTC    [provider]  tadalafil (CIALIS) 10 MG tablet Take 10 mg by mouth daily as needed. 02/27/22   [provider]    Allergies: Patient has no known allergies.    Review of Systems  Updated Vital Signs BP 105/69   Pulse 96   Temp 98.6 F (37 C) (Oral)   Resp 16   Ht 6' (1.829 m)   Wt 97.5 kg   SpO2 98%   BMI 29.16 kg/m   Physical Exam Vitals and nursing note reviewed.  Constitutional:      General: He is not in acute distress.    Appearance: He is well-developed.  HENT:     Head: Normocephalic and atraumatic.     Mouth/Throat:     Mouth: Mucous membranes are moist.  Eyes:     General: Vision grossly intact. Gaze aligned appropriately.     Extraocular Movements: Extraocular movements intact.     Conjunctiva/sclera: Conjunctivae normal.   Cardiovascular:     Rate and Rhythm: Regular rhythm. Tachycardia present.     Pulses: Normal pulses.     Heart sounds: Normal heart sounds, S1 normal and S2 normal. No murmur heard.    No friction rub. No gallop.  Pulmonary:     Effort: Pulmonary effort is normal. No respiratory distress.     Breath sounds: Normal breath sounds.  Abdominal:     Palpations: Abdomen is soft.     Tenderness: There is no abdominal tenderness. There is no guarding or rebound.     Hernia: No hernia is present.  Musculoskeletal:        General: No swelling.     Cervical back: Full passive range of motion without pain, normal range of motion and neck supple. No pain with movement, spinous process tenderness or muscular tenderness. Normal range of motion.     Right lower leg: No edema.     Left lower leg: No edema.  Skin:    General: Skin is warm and dry.     Capillary Refill: Capillary refill takes less than 2 seconds.     Findings: No ecchymosis, erythema, lesion or wound.  Neurological:     Mental Status: He is alert and oriented to person, place,  and time.     GCS: GCS eye subscore is 4. GCS verbal subscore is 5. GCS motor subscore is 6.     Cranial Nerves: Cranial nerves 2-12 are intact.     Sensory: Sensation is intact.     Motor: Motor function is intact. No weakness or abnormal muscle tone.     Coordination: Coordination is intact.  Psychiatric:        Mood and Affect: Mood normal.        Speech: Speech normal.        Behavior: Behavior normal.     (all labs ordered are listed, but only abnormal results are displayed) Labs Reviewed  COMPREHENSIVE METABOLIC PANEL WITH GFR - Abnormal; Notable for the following components:      Result Value   Sodium 121 (*)    Potassium 2.9 (*)    Chloride 87 (*)    CO2 18 (*)    Glucose, Bld 123 (*)    BUN 28 (*)    Creatinine, Ser 1.53 (*)    Calcium 8.6 (*)    AST 119 (*)    ALT 59 (*)    Total Bilirubin 4.0 (*)    GFR, Estimated 51 (*)    Anion  gap 17 (*)    All other components within normal limits  CBC WITH DIFFERENTIAL/PLATELET - Abnormal; Notable for the following components:   WBC 14.7 (*)    RBC 3.36 (*)    Hemoglobin 10.1 (*)    HCT 28.3 (*)    Neutro Abs 10.6 (*)    Monocytes Absolute 2.1 (*)    Abs Immature Granulocytes 0.09 (*)    All other components within normal limits  URINALYSIS, W/ REFLEX TO CULTURE (INFECTION SUSPECTED) - Abnormal; Notable for the following components:   Color, Urine ORANGE (*)    APPearance HAZY (*)    Hgb urine dipstick LARGE (*)    Protein, ur 30 (*)    Bacteria, UA RARE (*)    All other components within normal limits  CK - Abnormal; Notable for the following components:   Total CK 4,152 (*)    All other components within normal limits  RESP PANEL BY RT-PCR (RSV, FLU A&B, COVID)  RVPGX2  CULTURE, BLOOD (ROUTINE X 2)  CULTURE, BLOOD (ROUTINE X 2)  LACTIC ACID, PLASMA  PROTIME-INR  MAGNESIUM     EKG: EKG Interpretation Date/Time:  Sunday March 13 2024 00:23:53 EDT Ventricular Rate:  105 PR Interval:  161 QRS Duration:  98 QT Interval:  340 QTC Calculation: 450 R Axis:   66  Text Interpretation: Sinus tachycardia Inferior infarct, old Confirmed by Haze Lonni PARAS [phone]) on 03/13/2024 2:12:56 AM  Radiology: CT ABDOMEN PELVIS W CONTRAST Result Date: 03/13/2024 CLINICAL DATA:  Hiccups with elevated liver function tests and nonlocalized abdominal pain. EXAM: CT ABDOMEN AND PELVIS WITH CONTRAST TECHNIQUE: Multidetector CT imaging of the abdomen and pelvis was performed using the standard protocol following bolus administration of intravenous contrast. RADIATION DOSE REDUCTION: This exam was performed according to the departmental dose-optimization program which includes automated exposure control, adjustment of the mA and/or kV according to patient size and/or use of iterative reconstruction technique. CONTRAST:  100mL OMNIPAQUE  IOHEXOL  300 MG/ML  SOLN COMPARISON:  None  Available. FINDINGS: Lower chest: No acute abnormality. Hepatobiliary: Several small cysts are seen within the left lobe of the liver. Subcentimeter gallstones are seen within the neck of a contracted gallbladder. There is no evidence of gallbladder wall thickening, biliary dilatation  or pericholecystic inflammation. Pancreas: Unremarkable. No pancreatic ductal dilatation or surrounding inflammatory changes. Spleen: Normal in size without focal abnormality. Adrenals/Urinary Tract: Adrenal glands are unremarkable. Kidneys are normal in size without obstructing renal calculi. Mild to moderate severity left-sided hydronephrosis is seen. Bilateral simple renal cysts are also present. Cortical scarring is noted along the posteromedial aspect of the upper pole of the left kidney. Postoperative changes are also suspected within this region. Mild right-sided perinephric inflammatory fat stranding is seen. Bladder is unremarkable. Stomach/Bowel: Stomach is within normal limits. Appendix appears normal. No evidence of bowel wall thickening, distention, or inflammatory changes. Noninflamed diverticula are seen within the proximal sigmoid colon Vascular/Lymphatic: Aortic atherosclerosis. No enlarged abdominal or pelvic lymph nodes. Reproductive: The prostate gland is mildly enlarged. Other: No abdominal wall hernia or abnormality. No abdominopelvic ascites. Musculoskeletal: There is grade 2 anterolisthesis of the L5 vertebral body on S1. IMPRESSION: 1. Cholelithiasis. 2. Mild to moderate severity left-sided hydronephrosis without evidence of obstructing renal calculi. 3. Bilateral simple renal cysts. No follow-up imaging is recommended. This recommendation follows ACR consensus guidelines: Management of the Incidental Renal Mass on CT: A White Paper of the ACR Incidental Findings Committee. J Am Coll Radiol 2018;15:264-273. 4. Mild right perinephric inflammatory fat stranding which may represent sequelae associated with acute  pyelonephritis. Correlation with urinalysis is recommended. 5. Sigmoid diverticulosis. 6. Grade 2 anterolisthesis of the L5 vertebral body on S1. 7. Enlarged prostate gland. Correlation with PSA levels is recommended. 8. Aortic atherosclerosis. Electronically Signed   By: Suzen Dials M.D.   On: 03/13/2024 02:55   DG Chest Port 1 View Result Date: 03/13/2024 CLINICAL DATA:  Hiccups x3 days. EXAM: PORTABLE CHEST 1 VIEW COMPARISON:  May 27, 2006 FINDINGS: The heart size and mediastinal contours are within normal limits. There is moderate to marked severity right upper lobe infiltrate. No pleural effusion or pneumothorax is identified. Postoperative changes are seen within the cervical spine. Multilevel degenerative changes are present throughout the thoracic spine. IMPRESSION: Moderate to marked severity right upper lobe infiltrate. Follow-up to resolution is recommended to exclude the presence of an underlying neoplastic process. Electronically Signed   By: Suzen Dials M.D.   On: 03/13/2024 00:28     Procedures   Medications Ordered in the ED  potassium chloride  10 mEq in 100 mL IVPB (10 mEq Intravenous New Bag/Given 03/13/24 0421)  lactated ringers  infusion ( Intravenous New Bag/Given 03/13/24 0419)  lactated ringers  bolus 1,000 mL (0 mLs Intravenous Stopped 03/13/24 0225)  acetaminophen  (TYLENOL ) tablet 650 mg (650 mg Oral Given 03/13/24 0025)  iohexol  (OMNIPAQUE ) 300 MG/ML solution 100 mL (100 mLs Intravenous Contrast Given 03/13/24 0157)  ceFEPIme  (MAXIPIME ) 2 g in sodium chloride  0.9 % 100 mL IVPB (0 g Intravenous Stopped 03/13/24 0252)  metroNIDAZOLE  (FLAGYL ) IVPB 500 mg (0 mg Intravenous Stopped 03/13/24 0400)  vancomycin  (VANCOCIN ) IVPB 1000 mg/200 mL premix (0 mg Intravenous Stopped 03/13/24 0309)    Followed by  vancomycin  (VANCOCIN ) IVPB 1000 mg/200 mL premix (0 mg Intravenous Stopped 03/13/24 0415)  potassium chloride  SA (KLOR-CON  M) CR tablet 40 mEq (40 mEq Oral Given 03/13/24  0416)                                    Medical Decision Making Amount and/or Complexity of Data Reviewed Labs: ordered. Radiology: ordered.  Risk OTC drugs. Prescription drug management. Decision regarding hospitalization.   Differential diagnosis considered includes, but  not limited to: Viral illness; pneumonia; bacteremia; sepsis  Patient presents with nonspecific complaints of feeling generalized weakness and not like himself.  He reports that his doctor gave him a steroid shot and a cough syrup when he was seen a couple of days ago.  He does not feel like he is coughing very much.  He was noted to be febrile, temp 101.1 at arrival.  He had tachycardia and intermittent tachypnea.  Patient with leukocytosis, multiple SIRS criteria.  Administered IV antibiotics for sepsis coverage.  Lactic acid not elevated, no signs of septic shock.  Patient with mildly elevated AST and ALT.  With his hiccups, liver abnormality including abscess were considered.  No significant liver abnormalities were seen but he does have right perinephric stranding consistent with pyelonephritis which may be causing his current symptoms.  Additionally he has left-sided hydronephrosis without a stone.  Chest x-ray with right upper lobe pneumonia.  Patient's comprehensive metabolic panel was diffusely abnormal.  He has hyponatremia with a sodium of 121, hypokalemia as well.  Is mildly acidotic with a small anion gap.  Etiology unclear.  Patient given fluid bolus of 1 L at arrival.  Will add a second liter to help treat his acidosis and sodium.  Will need potassium replacement as well.  Will ask hospitalist to admit patient for further management.  Addendum: Discussed with Dr. Gurguis who recommended adding on a CPK because of hemoglobin without significant blood cells in the urine.  This was performed and he does have a CPK of 4000.  Patient currently receiving fluid resuscitation.  CRITICAL CARE Performed by:  Lonni JINNY Seats   Total critical care time: 32 minutes  Critical care time was exclusive of separately billable procedures and treating other patients.  Critical care was necessary to treat or prevent imminent or life-threatening deterioration.  Critical care was time spent personally by me on the following activities: development of treatment plan with patient and/or surrogate as well as nursing, discussions with consultants, evaluation of patient's response to treatment, examination of patient, obtaining history from patient or surrogate, ordering and performing treatments and interventions, ordering and review of laboratory studies, ordering and review of radiographic studies, pulse oximetry and re-evaluation of patient's condition.      Final diagnoses:  Hyponatremia  Hypokalemia  Increased anion gap metabolic acidosis  SIRS (systemic inflammatory response syndrome) (HCC)  Urinary tract infection without hematuria, site unspecified  Pneumonia of right upper lobe due to infectious organism  Non-traumatic rhabdomyolysis    ED Discharge Orders     None          Seats Lonni JINNY, MD 03/13/24 [id number]    Seats Lonni JINNY, MD 03/13/24 0430

## 2024-03-13 NOTE — Consult Note (Signed)
 I have been asked to see the patient by Dr. Alto Rake, for evaluation and management of hydronephrosis.SABRA  History of present illness: 63 year old male who presented for chronic cough.  Urology was consulted for slight AKI and concern for hydronephrosis.  Patient denies any flank pain gross hematuria or concerns with urination prior to admission.  He has had multiple stone surgeries done in the late 70s these were open ureterolithotomy's.  He has not had stone symptoms since.  He denies gross hematuria denies any flank pain.  He does have a history of slight CKD.  His creatinine is slightly elevated today at 1.4.  On my review of the CT there is not significant hydronephrosis there is some slight left pelviectasis.  I spoke that this is likely chronic.  There is also concern of urinary tract infection the UA does not reflect this.  CT does not show findings that would be similar to pyelonephritis.  Patient Nuys any history of GU malignancy, his only GU history is nephrolithiasis and the surgeries mentioned above.   Review of systems: A 1 shot 2 point comprehensive review of systems was obtained and is negative unless otherwise stated in the history of present illness.  Patient Active Problem List   Diagnosis Date Noted   Sepsis (HCC) 03/13/2024   Pneumonia of right upper lobe due to infectious organism 03/13/2024   Pain in right foot 03/22/2022   Pain in joint of left knee 08/14/2021   Excessive ear wax, bilateral 01/07/2017   Encounter for examination of blood pressure without abnormal findings 08/30/2014    No current facility-administered medications on file prior to encounter.   Current Outpatient Medications on File Prior to Encounter  Medication Sig Dispense Refill   allopurinol  (ZYLOPRIM ) 300 MG tablet Take 300 mg by mouth daily.     BENICAR HCT 40-25 MG per tablet Take 1 tablet by mouth daily.     Colchicine 0.6 MG CAPS Take 0.6 mg by mouth as needed.     fluticasone  (FLONASE) 50 MCG/ACT nasal spray Place 1 spray into both nostrils 2 (two) times daily as needed.     methocarbamol  (ROBAXIN ) 500 MG tablet Take 500 mg by mouth every 8 (eight) hours as needed for muscle spasms.     OVER THE COUNTER MEDICATION daily. Walitin Allergy OTC     promethazine-dextromethorphan  (PROMETHAZINE-DM) 6.25-15 MG/5ML syrup Take 10 mLs by mouth every 4 (four) hours as needed for cough.      Past Medical History:  Diagnosis Date   Chronic kidney disease    history of kidney stones   Gallstones    Hypertension     Past Surgical History:  Procedure Laterality Date   CARPAL TUNNEL RELEASE  2004   CERVICAL FUSION  10-2012   HERNIA REPAIR     umbilical hernia   KIDNEY STONE SURGERY  1978, 1983    Social History   Tobacco Use   Smoking status: Never   Smokeless tobacco: Never  Vaping Use   Vaping status: Never Used  Substance Use Topics   Alcohol  use: Not Currently    Alcohol /week: 1.0 standard drink of alcohol     Types: 1 Standard drinks or equivalent per week   Drug use: No    Family History  Problem Relation Age of Onset   Cancer Mother        throat   Cancer Maternal Grandmother        pancreatic   Cancer Paternal Grandmother  breast   Colon cancer Neg Hx    Colon polyps Neg Hx    Rectal cancer Neg Hx    Stomach cancer Neg Hx     PE: Vitals:   03/13/24 1122 03/13/24 1512 03/13/24 1713 03/13/24 1800  BP: (!) 103/58 104/75  101/65  Pulse: (!) 106 100  (!) 112  Resp: 18 17 18    Temp: 99.2 F (37.3 C) 98.7 F (37.1 C) (!) 102.9 F (39.4 C) 99 F (37.2 C)  TempSrc: Oral Oral Oral Oral  SpO2: 97% 99%    Weight:      Height:       General: Mild distress Respiratory: Patient coughing consistently during interview causing interrupted breathing Cardiovascular: Regular rate and rhythm per monitor Abdomen: Soft nontender nondistended GU: No flank pain bilaterally.  Recent Labs    03/13/24 0009  WBC 14.7*  HGB 10.1*  HCT 28.3*    Recent Labs    03/13/24 0009 03/13/24 0831 03/13/24 1312  NA 121* 123* 124*  K 2.9* 3.3* 3.1*  CL 87* 92* 94*  CO2 18* 18* 19*  GLUCOSE 123* 105* 141*  BUN 28* 22 22  CREATININE 1.53* 1.43* 1.45*  CALCIUM 8.6* 8.3* 8.3*   Recent Labs    03/13/24 0009  INR 1.1   No results for input(s): LABURIN in the last 72 hours. Results for orders placed or performed during the hospital encounter of 03/12/24  Blood Culture (routine x 2)     Status: None (Preliminary result)   Collection Time: 03/13/24 12:09 AM   Specimen: BLOOD LEFT FOREARM  Result Value Ref Range Status   Specimen Description   Final    BLOOD LEFT FOREARM Performed at [facility], [address], [place], [place] [address]    Special Requests   Final    BOTTLES DRAWN AEROBIC AND ANAEROBIC Blood Culture adequate volume Performed at [facility], [address], [place], [place] [address]    Culture PENDING  Incomplete   Report Status PENDING  Incomplete  Resp panel by RT-PCR (RSV, Flu A&B, Covid) Anterior Nasal Swab     Status: None   Collection Time: 03/13/24  2:04 AM   Specimen: Anterior Nasal Swab  Result Value Ref Range Status   SARS Coronavirus 2 by RT PCR NEGATIVE NEGATIVE Final    Comment: (NOTE) SARS-CoV-2 target nucleic acids are NOT DETECTED.  The SARS-CoV-2 RNA is generally detectable in upper respiratory specimens during the acute phase of infection. The lowest concentration of SARS-CoV-2 viral copies this assay can detect is 138 copies/mL. A negative result does not preclude SARS-Cov-2 infection and should not be used as the sole basis for treatment or other patient management decisions. A negative result may occur with  improper specimen collection/handling, submission of specimen other than nasopharyngeal swab, presence of viral mutation(s) within the areas targeted by this assay, and inadequate number of viral copies(<138 copies/mL). A negative result must be  combined with clinical observations, patient history, and epidemiological information. The expected result is Negative.  Fact Sheet for Patients:  BloggerCourse.com  Fact Sheet for Healthcare Providers:  SeriousBroker.it  This test is no t yet approved or cleared by the [place]  FDA and  has been authorized for detection and/or diagnosis of SARS-CoV-2 by FDA under an Emergency Use Authorization (EUA). This EUA will remain  in effect (meaning this test can be used) for the duration of the COVID-19 declaration under Section 564(b)(1) of the Act, 21 U.S.C.section 360bbb-3(b)(1), unless  the authorization is terminated  or revoked sooner.       Influenza A by PCR NEGATIVE NEGATIVE Final   Influenza B by PCR NEGATIVE NEGATIVE Final    Comment: (NOTE) The Xpert Xpress SARS-CoV-2/FLU/RSV plus assay is intended as an aid in the diagnosis of influenza from Nasopharyngeal swab specimens and should not be used as a sole basis for treatment. Nasal washings and aspirates are unacceptable for Xpert Xpress SARS-CoV-2/FLU/RSV testing.  Fact Sheet for Patients: BloggerCourse.com  Fact Sheet for Healthcare Providers: SeriousBroker.it  This test is not yet approved or cleared by the [place]  FDA and has been authorized for detection and/or diagnosis of SARS-CoV-2 by FDA under an Emergency Use Authorization (EUA). This EUA will remain in effect (meaning this test can be used) for the duration of the COVID-19 declaration under Section 564(b)(1) of the Act, 21 U.S.C. section 360bbb-3(b)(1), unless the authorization is terminated or revoked.     Resp Syncytial Virus by PCR NEGATIVE NEGATIVE Final    Comment: (NOTE) Fact Sheet for Patients: BloggerCourse.com  Fact Sheet for Healthcare Providers: SeriousBroker.it  This test is not yet  approved or cleared by the [place]  FDA and has been authorized for detection and/or diagnosis of SARS-CoV-2 by FDA under an Emergency Use Authorization (EUA). This EUA will remain in effect (meaning this test can be used) for the duration of the COVID-19 declaration under Section 564(b)(1) of the Act, 21 U.S.C. section 360bbb-3(b)(1), unless the authorization is terminated or revoked.  Performed at [facility], [address] [place], [place] [address]   MRSA Next Gen by PCR, Nasal     Status: None   Collection Time: 03/13/24  8:23 AM   Specimen: Nasal Mucosa; Nasal Swab  Result Value Ref Range Status   MRSA by PCR Next Gen NOT DETECTED NOT DETECTED Final    Comment: (NOTE) The GeneXpert MRSA Assay (FDA approved for NASAL specimens only), is one component of a comprehensive MRSA colonization surveillance program. It is not intended to diagnose MRSA infection nor to guide or monitor treatment for MRSA infections. Test performance is not FDA approved in patients less than 30 years old. Performed at [facility], [address], [place], [place] [address]     Imaging: CT 8/24. 1. Cholelithiasis. 2. Mild to moderate severity left-sided hydronephrosis without evidence of obstructing renal calculi. 3. Bilateral simple renal cysts. No follow-up imaging is recommended. This recommendation follows ACR consensus guidelines: Management of the Incidental Renal Mass on CT: A White Paper of the ACR Incidental Findings Committee. J Am Coll Radiol 2018;15:264-273. 4. Mild right perinephric inflammatory fat stranding which may represent sequelae associated with acute pyelonephritis. Correlation with urinalysis is recommended. 5. Sigmoid diverticulosis. 6. Grade 2 anterolisthesis of the L5 vertebral body on S1. 7. Enlarged prostate gland. Correlation with PSA levels is recommended. 8. Aortic atherosclerosis.  Imp: 63 year old gentleman admitted for chronic  cough and feeling poorly.  Urology was consulted for hydronephrosis.  On my review of the imaging the hydronephrosis does not appear significant appears more to be pelviectasis.  There is no hydronephrosis down to the UVJ.  There is low concern for pyelonephritis as UA is not concerning for infection and the CT does not demonstrate infectious findings.  Patient also denies any flank pain or nausea vomiting which would be consistent with pyelonephritis.  At this point suspect that patient's AKI is likely a chronic CKD that may be slightly worsening due to his chronic illness.  At this  point I do not feel like intervention is from urology is warranted.  We will set up follow-up with renal ultrasound and determine if pelviectasis is improving or worsening.  Recommendations: Urology will set up follow-up No urologic intervention warranted. Urology to sign off.   Thank you for involving me in this patient's care,  Steffan JAYSON Pea

## 2024-03-14 ENCOUNTER — Inpatient Hospital Stay (HOSPITAL_COMMUNITY)

## 2024-03-14 DIAGNOSIS — R008 Other abnormalities of heart beat: Secondary | ICD-10-CM

## 2024-03-14 DIAGNOSIS — J189 Pneumonia, unspecified organism: Secondary | ICD-10-CM | POA: Diagnosis not present

## 2024-03-14 LAB — TECHNOLOGIST SMEAR REVIEW: Plt Morphology: NORMAL

## 2024-03-14 LAB — BASIC METABOLIC PANEL WITH GFR
Anion gap: 11 (ref 5–15)
BUN: 24 mg/dL — ABNORMAL HIGH (ref 8–23)
CO2: 18 mmol/L — ABNORMAL LOW (ref 22–32)
Calcium: 8.3 mg/dL — ABNORMAL LOW (ref 8.9–10.3)
Chloride: 99 mmol/L (ref 98–111)
Creatinine, Ser: 1.45 mg/dL — ABNORMAL HIGH (ref 0.61–1.24)
GFR, Estimated: 55 mL/min — ABNORMAL LOW (ref >60)
Glucose, Bld: 106 mg/dL — ABNORMAL HIGH (ref 70–99)
Potassium: 3.5 mmol/L (ref 3.5–5.1)
Sodium: 128 mmol/L — ABNORMAL LOW (ref 135–145)

## 2024-03-14 LAB — RETICULOCYTES
Immature Retic Fract: 18 % — ABNORMAL HIGH (ref 2.3–15.9)
RBC.: 2.94 MIL/uL — ABNORMAL LOW (ref 4.22–5.81)
Retic Count, Absolute: 46.7 K/uL (ref 19.0–186.0)
Retic Ct Pct: 1.6 % (ref 0.4–3.1)

## 2024-03-14 LAB — CBC
HCT: 24.7 % — ABNORMAL LOW (ref 39.0–52.0)
Hemoglobin: 8.8 g/dL — ABNORMAL LOW (ref 13.0–17.0)
MCH: 29.9 pg (ref 26.0–34.0)
MCHC: 35.6 g/dL (ref 30.0–36.0)
MCV: 84 fL (ref 80.0–100.0)
Platelets: 292 K/uL (ref 150–400)
RBC: 2.94 MIL/uL — ABNORMAL LOW (ref 4.22–5.81)
RDW: 12.8 % (ref 11.5–15.5)
WBC: 12.3 K/uL — ABNORMAL HIGH (ref 4.0–10.5)
nRBC: 0 % (ref 0.0–0.2)

## 2024-03-14 LAB — COMPREHENSIVE METABOLIC PANEL WITH GFR
ALT: 69 U/L — ABNORMAL HIGH (ref 0–44)
AST: 176 U/L — ABNORMAL HIGH (ref 15–41)
Albumin: 2.6 g/dL — ABNORMAL LOW (ref 3.5–5.0)
Alkaline Phosphatase: 55 U/L (ref 38–126)
Anion gap: 9 (ref 5–15)
BUN: 23 mg/dL (ref 8–23)
CO2: 19 mmol/L — ABNORMAL LOW (ref 22–32)
Calcium: 8.4 mg/dL — ABNORMAL LOW (ref 8.9–10.3)
Chloride: 99 mmol/L (ref 98–111)
Creatinine, Ser: 1.43 mg/dL — ABNORMAL HIGH (ref 0.61–1.24)
GFR, Estimated: 56 mL/min — ABNORMAL LOW (ref >60)
Glucose, Bld: 104 mg/dL — ABNORMAL HIGH (ref 70–99)
Potassium: 3.6 mmol/L (ref 3.5–5.1)
Sodium: 127 mmol/L — ABNORMAL LOW (ref 135–145)
Total Bilirubin: 3.4 mg/dL — ABNORMAL HIGH (ref 0.0–1.2)
Total Protein: 6.5 g/dL (ref 6.5–8.1)

## 2024-03-14 LAB — BILIRUBIN, FRACTIONATED(TOT/DIR/INDIR)
Bilirubin, Direct: 1.4 mg/dL — ABNORMAL HIGH (ref 0.0–0.2)
Indirect Bilirubin: 1.8 mg/dL — ABNORMAL HIGH (ref 0.3–0.9)
Total Bilirubin: 3.2 mg/dL — ABNORMAL HIGH (ref 0.0–1.2)

## 2024-03-14 LAB — FERRITIN: Ferritin: 3677 ng/mL — ABNORMAL HIGH (ref 24–336)

## 2024-03-14 LAB — ECHOCARDIOGRAM COMPLETE
Area-P 1/2: 4.68 cm2
Height: 72 in
S' Lateral: 3.74 cm
Weight: 3582.4 [oz_av]

## 2024-03-14 LAB — IRON AND TIBC
Iron: 27 ug/dL — ABNORMAL LOW (ref 45–182)
Saturation Ratios: 13 % — ABNORMAL LOW (ref 17.9–39.5)
TIBC: 216 ug/dL — ABNORMAL LOW (ref 250–450)
UIBC: 189 ug/dL

## 2024-03-14 LAB — C-REACTIVE PROTEIN: CRP: 27.3 mg/dL — ABNORMAL HIGH (ref <1.0)

## 2024-03-14 LAB — URINALYSIS, ROUTINE W REFLEX MICROSCOPIC
Bacteria, UA: NONE SEEN
Bilirubin Urine: NEGATIVE
Glucose, UA: NEGATIVE mg/dL
Ketones, ur: 5 mg/dL — AB
Leukocytes,Ua: NEGATIVE
Nitrite: NEGATIVE
Protein, ur: 100 mg/dL — AB
Specific Gravity, Urine: 1.012 (ref 1.005–1.030)
pH: 5 (ref 5.0–8.0)

## 2024-03-14 LAB — CK: Total CK: 5854 U/L — ABNORMAL HIGH (ref 49–397)

## 2024-03-14 LAB — VITAMIN B12: Vitamin B-12: 774 pg/mL (ref 180–914)

## 2024-03-14 LAB — URINE CULTURE: Culture: NO GROWTH

## 2024-03-14 LAB — PROTEIN / CREATININE RATIO, URINE
Creatinine, Urine: 91 mg/dL
Protein Creatinine Ratio: 1.41 mg/mg{creat} — ABNORMAL HIGH (ref 0.00–0.15)
Total Protein, Urine: 128 mg/dL

## 2024-03-14 LAB — FOLATE: Folate: 10.7 ng/mL (ref >5.9)

## 2024-03-14 MED ORDER — ACETAMINOPHEN 500 MG PO TABS: 1000.0000 mg | ORAL_TABLET | Freq: Three times a day (TID) | ORAL | Status: DC

## 2024-03-14 MED ORDER — ACETAMINOPHEN 500 MG PO TABS
1000.0000 mg | ORAL_TABLET | Freq: Three times a day (TID) | ORAL | Status: DC
  Administered 2024-03-14 – 2024-03-16 (×5): 1000 mg via ORAL
  Filled 2024-03-14 (×6): qty 2

## 2024-03-14 MED ORDER — LACTATED RINGERS IV SOLN: INTRAVENOUS | Status: DC

## 2024-03-14 MED ORDER — PANTOPRAZOLE SODIUM 40 MG IV SOLR
40.0000 mg | INTRAVENOUS | Status: DC
  Administered 2024-03-14 – 2024-03-16 (×3): 40 mg via INTRAVENOUS
  Filled 2024-03-14 (×3): qty 10

## 2024-03-14 MED ORDER — METOCLOPRAMIDE HCL 5 MG PO TABS
5.0000 mg | ORAL_TABLET | Freq: Three times a day (TID) | ORAL | Status: DC | PRN
  Administered 2024-03-14 – 2024-03-15 (×2): 5 mg via ORAL
  Filled 2024-03-14 (×2): qty 1

## 2024-03-14 MED ORDER — ALLOPURINOL 300 MG PO TABS
300.0000 mg | ORAL_TABLET | Freq: Every day | ORAL | Status: DC
Start: 2024-03-14 — End: 2024-03-16
  Administered 2024-03-14 – 2024-03-16 (×3): 300 mg via ORAL
  Filled 2024-03-14 (×3): qty 1

## 2024-03-14 MED ORDER — MAGNESIUM SULFATE 2 GM/50ML IV SOLN
2.0000 g | Freq: Once | INTRAVENOUS | Status: AC
  Administered 2024-03-14: 2 g via INTRAVENOUS
  Filled 2024-03-14: qty 50

## 2024-03-14 MED ORDER — KETOROLAC TROMETHAMINE 15 MG/ML IJ SOLN
15.0000 mg | Freq: Once | INTRAMUSCULAR | Status: AC
  Administered 2024-03-14: 15 mg via INTRAVENOUS
  Filled 2024-03-14: qty 1

## 2024-03-14 MED ORDER — POTASSIUM CHLORIDE CRYS ER 20 MEQ PO TBCR
40.0000 meq | EXTENDED_RELEASE_TABLET | Freq: Once | ORAL | Status: AC
  Administered 2024-03-14: 40 meq via ORAL
  Filled 2024-03-14 (×2): qty 2

## 2024-03-14 NOTE — Plan of Care (Signed)
  Problem: Clinical Measurements: Goal: Diagnostic test results will improve Outcome: Progressing   Problem: Clinical Measurements: Goal: Will remain free from infection Outcome: Progressing   Problem: Activity: Goal: Risk for activity intolerance will decrease Outcome: Progressing   Problem: Nutrition: Goal: Adequate nutrition will be maintained Outcome: Progressing   Problem: Safety: Goal: Ability to remain free from injury will improve Outcome: Progressing

## 2024-03-14 NOTE — Progress Notes (Addendum)
 PROGRESS NOTE    BERND CROM  FMW:[id number] DOB: 01/19/1961 DOA: 03/12/2024 PCP: Larnell Hamilton, MD  Chief Complaint  Patient presents with   Hiccups    Brief Narrative:   63 yo with HTN, gout, BPH who presented with cough. Had negative covid test and was given steroid injection as well as cough syrup. He presented to ED with intractable hiccups. Workup thus far notable for sepsis in the setting of suspected CAP and hydronephrosis/pyelo. Also with hyponatremia, AKI, and evidence of rhabdo. Currently getting abx and IVF.   Assessment & Plan:   Principal Problem:   Sepsis (HCC) Active Problems:   Pneumonia of right upper lobe due to infectious organism  Sepsis due to Community Acquired Pneumonia  Ruled in with fever, leukocytosis, tachypnea and pneumonia on imaging CT chest with patchy airspace disease and consolidation in the RUL - needs follow up to resolution MRSA negative.  Urine strep negative.  Urine legionella pending (legionella could explain presentation with pneumonia, rhabdo, hyponatremia, recent travel, etc).  Sputum if able to collect. Negative RVP, negative covid, flu, RSV Follow blood cultures Continue ceftriaxone , azithromycin  Elevated pct and CRP  Rhabdomyolysis In setting of pneumonia above? No other obvious provoking factor.  No c/o muscle aches CK rising today despite IVF UA at presentation with +hb, but 0-5 RBC - RBC casts were present.  Urine myoglobin pending Will repeat UA Continue IVF  Intractable Hiccups Suspect related to pneumonia above? Trial PPI, reglan   AKI Baseline creatinine unknown Follow IVF Follow repeat UA in setting of RBC casts noted above, ? Significance with 0-5 RBC   Abnormal EKG Follow echo  Left Sided Hydronephrosis  Right Perinephric Fat Stranding  On abx as above UA with negative nitrite/LE - follow culture Urology c/s for hydro - no intervention from urology standpoint, planning to follow renal US      NAGMA Trend with IVF  mild   AKI  Trend with IVF   Elevated LFT's Trend, fractionate bili   Hyponatremia Improving with IVF, trend  Hypokalemia  Hypomagnesemia Replace and follow    HTN Bp meds on hold   BPH Noted, needs to follow PSA outpatient     DVT prophylaxis: lovenox  Code Status: full Family Communication: none Disposition:   Status is: Inpatient Remains inpatient appropriate because: need for continued inpatient care   Consultants:  urology  Procedures:  none  Antimicrobials:  Anti-infectives (From admission, onward)    Start     Dose/Rate Route Frequency Ordered Stop   03/13/24 1000  cefTRIAXone  (ROCEPHIN ) 2 g in sodium chloride  0.9 % 100 mL IVPB       Note to Pharmacy: Retime as needed   2 g 200 mL/hr over 30 Minutes Intravenous Every 24 hours 03/13/24 0822     03/13/24 1000  azithromycin  (ZITHROMAX ) tablet 500 mg        500 mg Oral Daily 03/13/24 0822 03/18/24 0959   03/13/24 0245  vancomycin  (VANCOCIN ) IVPB 1000 mg/200 mL premix       Placed in Followed by Linked Group   1,000 mg 200 mL/hr over 60 Minutes Intravenous  Once 03/13/24 0142 03/13/24 0415   03/13/24 0145  ceFEPIme  (MAXIPIME ) 2 g in sodium chloride  0.9 % 100 mL IVPB        2 g 200 mL/hr over 30 Minutes Intravenous  Once 03/13/24 0137 03/13/24 0252   03/13/24 0145  metroNIDAZOLE  (FLAGYL ) IVPB 500 mg        500 mg 100 mL/hr over 60  Minutes Intravenous  Once 03/13/24 0137 03/13/24 0400   03/13/24 0145  vancomycin  (VANCOCIN ) IVPB 1000 mg/200 mL premix  Status:  Discontinued        1,000 mg 200 mL/hr over 60 Minutes Intravenous  Once 03/13/24 0137 03/13/24 0142   03/13/24 0145  vancomycin  (VANCOCIN ) IVPB 1000 mg/200 mL premix       Placed in Followed by Linked Group   1,000 mg 200 mL/hr over 60 Minutes Intravenous  Once 03/13/24 0142 03/13/24 0309       Subjective: C/o hiccups No muscle aches, no fevers, chills, no CP, no SOB, no nausea, no LE  swelling  Objective: Vitals:   03/13/24 1800 03/14/24 0000 03/14/24 0530 03/14/24 0811  BP: 101/65 118/80 128/70 115/66  Pulse: (!) 112   (!) 105  Resp:  20 20 20   Temp: 99 F (37.2 C) 98.9 F (37.2 C) 99.5 F (37.5 C) 99.5 F (37.5 C)  TempSrc: Oral Oral Oral Oral  SpO2:  99% 96% 100%  Weight:   101.6 kg   Height:        Intake/Output Summary (Last 24 hours) at 03/14/2024 0815 Last data filed at 03/14/2024 0502 Gross per 24 hour  Intake 3112.83 ml  Output 1680 ml  Net 1432.83 ml   Filed Weights   03/13/24 0030 03/13/24 0516 03/14/24 0530  Weight: 97.5 kg 101.7 kg 101.6 kg    Examination:  General exam: sitting up in chair, frequent hiccups Respiratory system: unlabored Cardiovascular system: regular, tachy Gastrointestinal system: Abdomen is nondistended, soft and nontender. Central nervous system: Alert and oriented. No focal neurological deficits. Extremities: no LEE  Data Reviewed: I have personally reviewed following labs and imaging studies  CBC: Recent Labs  Lab 03/13/24 0009 03/14/24 0321  WBC 14.7* 12.3*  NEUTROABS 10.6*  --   HGB 10.1* 8.8*  HCT 28.3* 24.7*  MCV 84.2 84.0  PLT 278 292    Basic Metabolic Panel: Recent Labs  Lab 03/13/24 0009 03/13/24 0831 03/13/24 1312 03/13/24 2014 03/14/24 0321  NA 121* 123* 124*  --  127*  K 2.9* 3.3* 3.1*  --  3.6  CL 87* 92* 94*  --  99  CO2 18* 18* 19*  --  19*  GLUCOSE 123* 105* 141*  --  104*  BUN 28* 22 22  --  23  CREATININE 1.53* 1.43* 1.45*  --  1.43*  CALCIUM 8.6* 8.3* 8.3*  --  8.4*  MG 1.7  --   --  1.6*  --     GFR: Estimated Creatinine Clearance: 66.9 mL/min (Michole Lecuyer) (by C-G formula based on SCr of 1.43 mg/dL (H)).  Liver Function Tests: Recent Labs  Lab 03/13/24 0009 03/13/24 1459 03/14/24 0321  AST 119* 121* 176*  ALT 59* 53* 69*  ALKPHOS 74 49 55  BILITOT 4.0* 3.1* 3.4*  PROT 7.4 5.9* 6.5  ALBUMIN 3.9 2.5* 2.6*    CBG: No results for input(s): GLUCAP in the last 168  hours.   Recent Results (from the past 240 hours)  Blood Culture (routine x 2)     Status: None (Preliminary result)   Collection Time: 03/13/24 12:09 AM   Specimen: BLOOD LEFT FOREARM  Result Value Ref Range Status   Specimen Description   Final    BLOOD LEFT FOREARM Performed at [facility], [address], [place], [place] [address]    Special Requests   Final    BOTTLES DRAWN AEROBIC AND ANAEROBIC Blood Culture adequate volume  Performed at [facility], [address], [place], [place] [address]    Culture PENDING  Incomplete   Report Status PENDING  Incomplete  Resp panel by RT-PCR (RSV, Flu Cyan Clippinger&B, Covid) Anterior Nasal Swab     Status: None   Collection Time: 03/13/24  2:04 AM   Specimen: Anterior Nasal Swab  Result Value Ref Range Status   SARS Coronavirus 2 by RT PCR NEGATIVE NEGATIVE Final    Comment: (NOTE) SARS-CoV-2 target nucleic acids are NOT DETECTED.  The SARS-CoV-2 RNA is generally detectable in upper respiratory specimens during the acute phase of infection. The lowest concentration of SARS-CoV-2 viral copies this assay can detect is 138 copies/mL. Ryleigh Esqueda negative result does not preclude SARS-Cov-2 infection and should not be used as the sole basis for treatment or other patient management decisions. Deklen Popelka negative result may occur with  improper specimen collection/handling, submission of specimen other than nasopharyngeal swab, presence of viral mutation(s) within the areas targeted by this assay, and inadequate number of viral copies(<138 copies/mL). Airiel Oblinger negative result must be combined with clinical observations, patient history, and epidemiological information. The expected result is Negative.  Fact Sheet for Patients:  BloggerCourse.com  Fact Sheet for Healthcare Providers:  SeriousBroker.it  This test is no t yet approved or cleared by the [place]  FDA and  has been authorized  for detection and/or diagnosis of SARS-CoV-2 by FDA under an Emergency Use Authorization (EUA). This EUA will remain  in effect (meaning this test can be used) for the duration of the COVID-19 declaration under Section 564(b)(1) of the Act, 21 U.S.C.section 360bbb-3(b)(1), unless the authorization is terminated  or revoked sooner.       Influenza Alix Stowers by PCR NEGATIVE NEGATIVE Final   Influenza B by PCR NEGATIVE NEGATIVE Final    Comment: (NOTE) The Xpert Xpress SARS-CoV-2/FLU/RSV plus assay is intended as an aid in the diagnosis of influenza from Nasopharyngeal swab specimens and should not be used as Junius Faucett sole basis for treatment. Nasal washings and aspirates are unacceptable for Xpert Xpress SARS-CoV-2/FLU/RSV testing.  Fact Sheet for Patients: BloggerCourse.com  Fact Sheet for Healthcare Providers: SeriousBroker.it  This test is not yet approved or cleared by the [place]  FDA and has been authorized for detection and/or diagnosis of SARS-CoV-2 by FDA under an Emergency Use Authorization (EUA). This EUA will remain in effect (meaning this test can be used) for the duration of the COVID-19 declaration under Section 564(b)(1) of the Act, 21 U.S.C. section 360bbb-3(b)(1), unless the authorization is terminated or revoked.     Resp Syncytial Virus by PCR NEGATIVE NEGATIVE Final    Comment: (NOTE) Fact Sheet for Patients: BloggerCourse.com  Fact Sheet for Healthcare Providers: SeriousBroker.it  This test is not yet approved or cleared by the [place]  FDA and has been authorized for detection and/or diagnosis of SARS-CoV-2 by FDA under an Emergency Use Authorization (EUA). This EUA will remain in effect (meaning this test can be used) for the duration of the COVID-19 declaration under Section 564(b)(1) of the Act, 21 U.S.C. section 360bbb-3(b)(1), unless the authorization is  terminated or revoked.  Performed at [facility], [address], [place], [place] [address]   MRSA Next Gen by PCR, Nasal     Status: None   Collection Time: 03/13/24  8:23 AM   Specimen: Nasal Mucosa; Nasal Swab  Result Value Ref Range Status   MRSA by PCR Next Gen NOT DETECTED NOT DETECTED Final    Comment: (NOTE) The GeneXpert MRSA  Assay (FDA approved for NASAL specimens only), is one component of Cesily Cuoco comprehensive MRSA colonization surveillance program. It is not intended to diagnose MRSA infection nor to guide or monitor treatment for MRSA infections. Test performance is not FDA approved in patients less than 79 years old. Performed at [facility], [address], [place], [place] [address]   Respiratory (~20 pathogens) panel by PCR     Status: None   Collection Time: 03/13/24  6:20 PM   Specimen: Nasopharyngeal Swab; Respiratory  Result Value Ref Range Status   Adenovirus NOT DETECTED NOT DETECTED Final   Coronavirus 229E NOT DETECTED NOT DETECTED Final    Comment: (NOTE) The Coronavirus on the Respiratory Panel, DOES NOT test for the novel  Coronavirus (2019 nCoV)    Coronavirus HKU1 NOT DETECTED NOT DETECTED Final   Coronavirus NL63 NOT DETECTED NOT DETECTED Final   Coronavirus OC43 NOT DETECTED NOT DETECTED Final   Metapneumovirus NOT DETECTED NOT DETECTED Final   Rhinovirus / Enterovirus NOT DETECTED NOT DETECTED Final   Influenza [place] Aust NOT DETECTED NOT DETECTED Final   Influenza B NOT DETECTED NOT DETECTED Final   Parainfluenza Virus 1 NOT DETECTED NOT DETECTED Final   Parainfluenza Virus 2 NOT DETECTED NOT DETECTED Final   Parainfluenza Virus 3 NOT DETECTED NOT DETECTED Final   Parainfluenza Virus 4 NOT DETECTED NOT DETECTED Final   Respiratory Syncytial Virus NOT DETECTED NOT DETECTED Final   Bordetella pertussis NOT DETECTED NOT DETECTED Final   Bordetella Parapertussis NOT DETECTED NOT DETECTED Final   Chlamydophila pneumoniae NOT  DETECTED NOT DETECTED Final   Mycoplasma pneumoniae NOT DETECTED NOT DETECTED Final    Comment: Performed at [facility], [address] [place], [place] [address]         Radiology Studies: CT CHEST WO CONTRAST Result Date: 03/13/2024 CLINICAL DATA:  Respiratory illness, nondiagnostic x-ray. EXAM: CT CHEST WITHOUT CONTRAST TECHNIQUE: Multidetector CT imaging of the chest was performed following the standard protocol without IV contrast. RADIATION DOSE REDUCTION: This exam was performed according to the departmental dose-optimization program which includes automated exposure control, adjustment of the mA and/or kV according to patient size and/or use of iterative reconstruction technique. COMPARISON:  03/13/2024. FINDINGS: Cardiovascular: The heart is normal in size and there is Marium Ragan trace pericardial effusion. The aorta and pulmonary trunk are normal in caliber. Mediastinum/Nodes: No mediastinal or axillary lymphadenopathy is seen. Evaluation of the hila is limited due to lack of IV contrast. The thyroid gland, trachea, and esophagus are within normal limits. Lungs/Pleura: There is Jetty Berland trace right pleural effusion. Patchy airspace disease with consolidation is present in the right upper lobe. The left lung is clear. No pneumothorax is seen. Upper Abdomen: Braelon Sprung scattered hypodensities are noted in the liver, most consistent with cysts. Stones are present within the gallbladder. No acute abnormality. Musculoskeletal: Degenerative changes are present in the thoracic spine. No acute osseous abnormality is seen. Cervical spinal fusion hardware is noted. IMPRESSION: 1. Patchy airspace disease and consolidation in the right upper lobe suggesting pneumonia. Continue follow-up is recommended until resolution. 2. Cholelithiasis. Electronically Signed   By: Leita Birmingham M.D.   On: 03/13/2024 18:27   CT ABDOMEN PELVIS W CONTRAST Result Date: 03/13/2024 CLINICAL DATA:  Hiccups with elevated liver function tests  and nonlocalized abdominal pain. EXAM: CT ABDOMEN AND PELVIS WITH CONTRAST TECHNIQUE: Multidetector CT imaging of the abdomen and pelvis was performed using the standard protocol following bolus administration of intravenous contrast. RADIATION DOSE REDUCTION:  This exam was performed according to the departmental dose-optimization program which includes automated exposure control, adjustment of the mA and/or kV according to patient size and/or use of iterative reconstruction technique. CONTRAST:  OMNIPAQUE  IOHEXOL  300 MG/ML  SOLN COMPARISON:  None Available. FINDINGS: Lower chest: No acute abnormality. Hepatobiliary: Several small cysts are seen within the left lobe of the liver. Subcentimeter gallstones are seen within the neck of Kirby Argueta contracted gallbladder. There is no evidence of gallbladder wall thickening, biliary dilatation or pericholecystic inflammation. Pancreas: Unremarkable. No pancreatic ductal dilatation or surrounding inflammatory changes. Spleen: Normal in size without focal abnormality. Adrenals/Urinary Tract: Adrenal glands are unremarkable. Kidneys are normal in size without obstructing renal calculi. Mild to moderate severity left-sided hydronephrosis is seen. Bilateral simple renal cysts are also present. Cortical scarring is noted along the posteromedial aspect of the upper pole of the left kidney. Postoperative changes are also suspected within this region. Mild right-sided perinephric inflammatory fat stranding is seen. Bladder is unremarkable. Stomach/Bowel: Stomach is within normal limits. Appendix appears normal. No evidence of bowel wall thickening, distention, or inflammatory changes. Noninflamed diverticula are seen within the proximal sigmoid colon Vascular/Lymphatic: Aortic atherosclerosis. No enlarged abdominal or pelvic lymph nodes. Reproductive: The prostate gland is mildly enlarged. Other: No abdominal wall hernia or abnormality. No abdominopelvic ascites. Musculoskeletal:  There is grade 2 anterolisthesis of the L5 vertebral body on S1. IMPRESSION: 1. Cholelithiasis. 2. Mild to moderate severity left-sided hydronephrosis without evidence of obstructing renal calculi. 3. Bilateral simple renal cysts. No follow-up imaging is recommended. This recommendation follows ACR consensus guidelines: Management of the Incidental Renal Mass on CT: Janele Lague White Paper of the ACR Incidental Findings Committee. J Am Coll Radiol 2018;15:264-273. 4. Mild right perinephric inflammatory fat stranding which may represent sequelae associated with acute pyelonephritis. Correlation with urinalysis is recommended. 5. Sigmoid diverticulosis. 6. Grade 2 anterolisthesis of the L5 vertebral body on S1. 7. Enlarged prostate gland. Correlation with PSA levels is recommended. 8. Aortic atherosclerosis. Electronically Signed   By: Suzen Dials M.D.   On: 03/13/2024 02:55   DG Chest Port 1 View Result Date: 03/13/2024 CLINICAL DATA:  Hiccups x3 days. EXAM: PORTABLE CHEST 1 VIEW COMPARISON:  May 27, 2006 FINDINGS: The heart size and mediastinal contours are within normal limits. There is moderate to marked severity right upper lobe infiltrate. No pleural effusion or pneumothorax is identified. Postoperative changes are seen within the cervical spine. Multilevel degenerative changes are present throughout the thoracic spine. IMPRESSION: Moderate to marked severity right upper lobe infiltrate. Follow-up to resolution is recommended to exclude the presence of an underlying neoplastic process. Electronically Signed   By: Suzen Dials M.D.   On: 03/13/2024 00:28        Scheduled Meds:  azithromycin   500 mg Oral Daily   enoxaparin  (LOVENOX ) injection  40 mg Subcutaneous Q24H   guaiFENesin   15 mL Oral BID   potassium chloride   40 mEq Oral Once   sodium chloride  flush  3 mL Intravenous Q12H   Continuous Infusions:  cefTRIAXone  (ROCEPHIN )  IV Stopped (03/13/24 0914)   lactated ringers  200 mL/hr at  03/14/24 0502   magnesium  sulfate bolus IVPB 2 g (03/14/24 0742)     LOS: 1 day    Time spent: over 30 min     Meliton Monte, MD [facility]   To contact the attending provider between 7A-7P or the covering provider during after hours 7P-7A, please log into the web site [email or web address] and access using universal   password for that web site. If you do not have the password, please call the hospital operator.  03/14/2024, 8:15 AM

## 2024-03-15 ENCOUNTER — Inpatient Hospital Stay (HOSPITAL_COMMUNITY)

## 2024-03-15 DIAGNOSIS — R609 Edema, unspecified: Secondary | ICD-10-CM

## 2024-03-15 DIAGNOSIS — J189 Pneumonia, unspecified organism: Secondary | ICD-10-CM | POA: Diagnosis not present

## 2024-03-15 LAB — CBC WITH DIFFERENTIAL/PLATELET
Abs Immature Granulocytes: 0.19 K/uL — ABNORMAL HIGH (ref 0.00–0.07)
Basophils Absolute: 0.1 K/uL (ref 0.0–0.1)
Basophils Relative: 1 %
Eosinophils Absolute: 0.2 K/uL (ref 0.0–0.5)
Eosinophils Relative: 2 %
HCT: 27.9 % — ABNORMAL LOW (ref 39.0–52.0)
Hemoglobin: 9.5 g/dL — ABNORMAL LOW (ref 13.0–17.0)
Immature Granulocytes: 2 %
Lymphocytes Relative: 16 %
Lymphs Abs: 1.4 K/uL (ref 0.7–4.0)
MCH: 29.4 pg (ref 26.0–34.0)
MCHC: 34.1 g/dL (ref 30.0–36.0)
MCV: 86.4 fL (ref 80.0–100.0)
Monocytes Absolute: 1.5 K/uL — ABNORMAL HIGH (ref 0.1–1.0)
Monocytes Relative: 17 %
Neutro Abs: 5.4 K/uL (ref 1.7–7.7)
Neutrophils Relative %: 62 %
Platelets: 321 K/uL (ref 150–400)
RBC: 3.23 MIL/uL — ABNORMAL LOW (ref 4.22–5.81)
RDW: 13.2 % (ref 11.5–15.5)
WBC: 8.7 K/uL (ref 4.0–10.5)
nRBC: 0 % (ref 0.0–0.2)

## 2024-03-15 LAB — COMPREHENSIVE METABOLIC PANEL WITH GFR
ALT: 74 U/L — ABNORMAL HIGH (ref 0–44)
AST: 172 U/L — ABNORMAL HIGH (ref 15–41)
Albumin: 2.3 g/dL — ABNORMAL LOW (ref 3.5–5.0)
Alkaline Phosphatase: 52 U/L (ref 38–126)
Anion gap: 8 (ref 5–15)
BUN: 28 mg/dL — ABNORMAL HIGH (ref 8–23)
CO2: 22 mmol/L (ref 22–32)
Calcium: 8.7 mg/dL — ABNORMAL LOW (ref 8.9–10.3)
Chloride: 103 mmol/L (ref 98–111)
Creatinine, Ser: 1.65 mg/dL — ABNORMAL HIGH (ref 0.61–1.24)
GFR, Estimated: 47 mL/min — ABNORMAL LOW (ref >60)
Glucose, Bld: 97 mg/dL (ref 70–99)
Potassium: 3.6 mmol/L (ref 3.5–5.1)
Sodium: 133 mmol/L — ABNORMAL LOW (ref 135–145)
Total Bilirubin: 1.8 mg/dL — ABNORMAL HIGH (ref 0.0–1.2)
Total Protein: 6 g/dL — ABNORMAL LOW (ref 6.5–8.1)

## 2024-03-15 LAB — CK: Total CK: 4122 U/L — ABNORMAL HIGH (ref 49–397)

## 2024-03-15 LAB — MAGNESIUM: Magnesium: 2.2 mg/dL (ref 1.7–2.4)

## 2024-03-15 LAB — MYOGLOBIN, URINE: Myoglobin, Ur: 1602 ng/mL — ABNORMAL HIGH (ref 0–13)

## 2024-03-15 LAB — C-REACTIVE PROTEIN: CRP: 26.8 mg/dL — ABNORMAL HIGH (ref <1.0)

## 2024-03-15 LAB — PHOSPHORUS: Phosphorus: 3.4 mg/dL (ref 2.5–4.6)

## 2024-03-15 MED ORDER — POTASSIUM CHLORIDE CRYS ER 20 MEQ PO TBCR
40.0000 meq | EXTENDED_RELEASE_TABLET | Freq: Once | ORAL | Status: AC
Start: 2024-03-15 — End: 2024-03-15
  Administered 2024-03-15: 40 meq via ORAL
  Filled 2024-03-15: qty 2

## 2024-03-15 MED ORDER — CHLORPROMAZINE HCL 25 MG PO TABS: 25.0000 mg | ORAL_TABLET | Freq: Three times a day (TID) | ORAL | Status: DC

## 2024-03-15 MED ORDER — CHLORPROMAZINE HCL 25 MG PO TABS
25.0000 mg | ORAL_TABLET | Freq: Once | ORAL | Status: AC
  Administered 2024-03-15: 25 mg via ORAL
  Filled 2024-03-15: qty 1

## 2024-03-15 MED ORDER — CHLORPROMAZINE HCL 25 MG PO TABS
25.0000 mg | ORAL_TABLET | Freq: Three times a day (TID) | ORAL | Status: AC
  Administered 2024-03-15 (×2): 25 mg via ORAL
  Filled 2024-03-15 (×3): qty 1

## 2024-03-15 NOTE — Plan of Care (Signed)

## 2024-03-15 NOTE — Progress Notes (Signed)
 Bilateral lower extremity venous duplex has been completed. Preliminary results can be found in CV Proc through chart review.   03/15/24 11:13 AM Cathlyn Collet RVT

## 2024-03-15 NOTE — Progress Notes (Signed)
 PROGRESS NOTE    Daniel Velez  FMW:[id number] DOB: April 22, 1961 DOA: 03/12/2024 PCP: Daniel Hamilton, MD  Chief Complaint  Patient presents with   Hiccups    Brief Narrative:   63 yo with HTN, gout, BPH who presented with cough. Had negative covid test and was given steroid injection as well as cough syrup. He presented to ED with intractable hiccups. Workup thus far notable for sepsis in the setting of suspected CAP and hydronephrosis/pyelo. Also with hyponatremia, AKI, and evidence of rhabdo. Currently getting abx and IVF.   He's been on IV abx for CAP.  Urine legionella pending.  He's been gradually improving.  Hopefully discharge in [name].    Assessment & Plan:   Principal Problem:   Sepsis (HCC) Active Problems:   Pneumonia of right upper lobe due to infectious organism  Sepsis due to Community Acquired Pneumonia  Ruled in with fever, leukocytosis, tachypnea and pneumonia on imaging Last fever 8/25 PM CT chest with patchy airspace disease and consolidation in the RUL - needs follow up imaging to resolution MRSA negative.  Urine strep negative.  Urine legionella pending (legionella could explain his presentation with pneumonia, rhabdo, hyponatremia, recent travel, etc).  Sputum if able to collect. Negative RVP, negative covid, flu, RSV Follow blood cultures Continue ceftriaxone , azithromycin  Elevated pct and CRP  Rhabdomyolysis In setting of pneumonia above? No other obvious provoking factor.  No c/o muscle aches CK improving with IVF UA at presentation with +hb, but 0-5 RBC - RBC casts were present.  Urine myoglobin pending Will repeat UA again with + Hb, but 0-5 RBC - no RBC cast noted on repeat UA.  Continue IVF  Intractable Hiccups Suspect related to pneumonia above? Trial PPI, reglan   AKI  Proteinuria  Baseline creatinine unknown Follow IVF - trending up, follow UP/C 1.4 Follow repeat UA in setting of RBC casts noted above, ? Significance with 0-5  RBC.  Not present on repeat UA.     Abnormal EKG EF 55-60%, no RWMA, RVSF normal, IVC normal in size with >50% resp variability  Left Sided Hydronephrosis  Right Perinephric Fat Stranding  On abx as above UA with negative nitrite/LE - follow culture Urology c/s for hydro - no intervention from urology standpoint, planning to follow renal US  outpatient   Mild Left Lower Extremity Edema Recent long bus ride Follow LE US   NAGMA Resolved today   Elevated LFT's Fluctuating, trend  Hyponatremia Improving with IVF, trend  Hypokalemia  Hypomagnesemia Replace and follow    HTN Bp meds on hold   BPH Noted, needs to follow PSA outpatient     DVT prophylaxis: lovenox  Code Status: full Family Communication: none Disposition:   Status is: Inpatient Remains inpatient appropriate because: need for continued inpatient care   Consultants:  urology  Procedures:  none  Antimicrobials:  Anti-infectives (From admission, onward)    Start     Dose/Rate Route Frequency Ordered Stop   03/13/24 1000  cefTRIAXone  (ROCEPHIN ) 2 g in sodium chloride  0.9 % 100 mL IVPB       Note to Pharmacy: Retime as needed   2 g 200 mL/hr over 30 Minutes Intravenous Every 24 hours 03/13/24 0822     03/13/24 1000  azithromycin  (ZITHROMAX ) tablet 500 mg        500 mg Oral Daily 03/13/24 0822 03/18/24 0959   03/13/24 0245  vancomycin  (VANCOCIN ) IVPB 1000 mg/200 mL premix       Placed in Followed by Linked  Group   1,000 mg 200 mL/hr over 60 Minutes Intravenous  Once 03/13/24 0142 03/13/24 0415   03/13/24 0145  ceFEPIme  (MAXIPIME ) 2 g in sodium chloride  0.9 % 100 mL IVPB        2 g 200 mL/hr over 30 Minutes Intravenous  Once 03/13/24 0137 03/13/24 0252   03/13/24 0145  metroNIDAZOLE  (FLAGYL ) IVPB 500 mg        500 mg 100 mL/hr over 60 Minutes Intravenous  Once 03/13/24 0137 03/13/24 0400   03/13/24 0145  vancomycin  (VANCOCIN ) IVPB 1000 mg/200 mL premix  Status:  Discontinued        1,000 mg 200  mL/hr over 60 Minutes Intravenous  Once 03/13/24 0137 03/13/24 0142   03/13/24 0145  vancomycin  (VANCOCIN ) IVPB 1000 mg/200 mL premix       Placed in Followed by Linked Group   1,000 mg 200 mL/hr over 60 Minutes Intravenous  Once 03/13/24 0142 03/13/24 0309       Subjective: Continued c/o hiccups  Objective: Vitals:   03/14/24 2322 03/15/24 0453 03/15/24 0606 03/15/24 0608  BP: 105/75 119/81    Pulse: 78     Resp: 19 20 (!) 27 20  Temp: 98 F (36.7 C) 98 F (36.7 C)    TempSrc: Oral Oral    SpO2: 96% 99%    Weight:   102.2 kg   Height:        Intake/Output Summary (Last 24 hours) at 03/15/2024 0939 Last data filed at 03/15/2024 0441 Gross per 24 hour  Intake 2824.85 ml  Output 530 ml  Net 2294.85 ml   Filed Weights   03/13/24 0516 03/14/24 0530 03/15/24 0606  Weight: 101.7 kg 101.6 kg 102.2 kg    Examination:  General: sitting up in chair Cardiovascular: sinus tach on tele, regular, tachycardic rate Lungs: mildly tachpneic, CTAB Abdomen: Soft, nontender, nondistended - frequent hiccups Neurological: Alert and oriented 3. Moves all extremities 4 with equal strength. Cranial nerves II through XII grossly intact. Extremities: No clubbing or cyanosis. No edema.  Data Reviewed: I have personally reviewed following labs and imaging studies  CBC: Recent Labs  Lab 03/13/24 0009 03/14/24 0321 03/15/24 0438  WBC 14.7* 12.3* 8.7  NEUTROABS 10.6*  --  5.4  HGB 10.1* 8.8* 9.5*  HCT 28.3* 24.7* 27.9*  MCV 84.2 84.0 86.4  PLT 278 292 321    Basic Metabolic Panel: Recent Labs  Lab 03/13/24 0009 03/13/24 0831 03/13/24 1312 03/13/24 2014 03/14/24 0321 03/14/24 1847 03/15/24 0438  NA 121* 123* 124*  --  127* 128* 133*  K 2.9* 3.3* 3.1*  --  3.6 3.5 3.6  CL 87* 92* 94*  --  99 99 103  CO2 18* 18* 19*  --  19* 18* 22  GLUCOSE 123* 105* 141*  --  104* 106* 97  BUN 28* 22 22  --  23 24* 28*  CREATININE 1.53* 1.43* 1.45*  --  1.43* 1.45* 1.65*  CALCIUM 8.6*  8.3* 8.3*  --  8.4* 8.3* 8.7*  MG 1.7  --   --  1.6*  --   --  2.2  PHOS  --   --   --   --   --   --  3.4    GFR: Estimated Creatinine Clearance: 58.1 mL/min (Daniel Velez) (by C-G formula based on SCr of 1.65 mg/dL (H)).  Liver Function Tests: Recent Labs  Lab 03/13/24 0009 03/13/24 1459 03/14/24 0321 03/15/24 0438  AST 119* 121* 176* 172*  ALT 59* 53* 69* 74*  ALKPHOS 74 49 55 52  BILITOT 4.0* 3.1* 3.2*  3.4* 1.8*  PROT 7.4 5.9* 6.5 6.0*  ALBUMIN 3.9 2.5* 2.6* 2.3*    CBG: No results for input(s): GLUCAP in the last 168 hours.   Recent Results (from the past 240 hours)  Blood Culture (routine x 2)     Status: None (Preliminary result)   Collection Time: 03/13/24 12:09 AM   Specimen: BLOOD  Result Value Ref Range Status   Specimen Description   Final    BLOOD RIGHT ANTECUBITAL Performed at [facility], [address] [place], [place] [address]    Special Requests   Final    BOTTLES DRAWN AEROBIC AND ANAEROBIC Blood Culture adequate volume Performed at [facility], [address] [place], [place] [address]    Culture   Final    NO GROWTH 2 DAYS Performed at [facility], [address], [place], [place] [address]    Report Status PENDING  Incomplete  Blood Culture (routine x 2)     Status: None (Preliminary result)   Collection Time: 03/13/24 12:09 AM   Specimen: BLOOD LEFT FOREARM  Result Value Ref Range Status   Specimen Description   Final    BLOOD LEFT FOREARM Performed at [facility], [address], [place], [place] [address]    Special Requests   Final    BOTTLES DRAWN AEROBIC AND ANAEROBIC Blood Culture adequate volume Performed at [facility], [address], [place], [place] [address]    Culture   Final    NO GROWTH 2 DAYS Performed at [facility], [address], [place], [place] [address]    Report Status PENDING  Incomplete  Resp panel by RT-PCR (RSV, Flu Joelyn Lover&B,  Covid) Anterior Nasal Swab     Status: None   Collection Time: 03/13/24  2:04 AM   Specimen: Anterior Nasal Swab  Result Value Ref Range Status   SARS Coronavirus 2 by RT PCR NEGATIVE NEGATIVE Final    Comment: (NOTE) SARS-CoV-2 target nucleic acids are NOT DETECTED.  The SARS-CoV-2 RNA is generally detectable in upper respiratory specimens during the acute phase of infection. The lowest concentration of SARS-CoV-2 viral copies this assay can detect is 138 copies/mL. Canna Nickelson negative result does not preclude SARS-Cov-2 infection and should not be used as the sole basis for treatment or other patient management decisions. Barbara Keng negative result may occur with  improper specimen collection/handling, submission of specimen other than nasopharyngeal swab, presence of viral mutation(s) within the areas targeted by this assay, and inadequate number of viral copies(<138 copies/mL). Jeani Fassnacht negative result must be combined with clinical observations, patient history, and epidemiological information. The expected result is Negative.  Fact Sheet for Patients:  BloggerCourse.com  Fact Sheet for Healthcare Providers:  SeriousBroker.it  This test is no t yet approved or cleared by the [place]  FDA and  has been authorized for detection and/or diagnosis of SARS-CoV-2 by FDA under an Emergency Use Authorization (EUA). This EUA will remain  in effect (meaning this test can be used) for the duration of the COVID-19 declaration under Section 564(b)(1) of the Act, 21 U.S.C.section 360bbb-3(b)(1), unless the authorization is terminated  or revoked sooner.       Influenza Chanice Brenton by PCR NEGATIVE NEGATIVE Final   Influenza B by PCR NEGATIVE NEGATIVE Final    Comment: (NOTE) The Xpert Xpress SARS-CoV-2/FLU/RSV plus assay is intended as an aid in  the diagnosis of influenza from Nasopharyngeal swab specimens and should not be used as Cherree Conerly sole basis for treatment. Nasal  washings and aspirates are unacceptable for Xpert Xpress SARS-CoV-2/FLU/RSV testing.  Fact Sheet for Patients: BloggerCourse.com  Fact Sheet for Healthcare Providers: SeriousBroker.it  This test is not yet approved or cleared by the [place]  FDA and has been authorized for detection and/or diagnosis of SARS-CoV-2 by FDA under an Emergency Use Authorization (EUA). This EUA will remain in effect (meaning this test can be used) for the duration of the COVID-19 declaration under Section 564(b)(1) of the Act, 21 U.S.C. section 360bbb-3(b)(1), unless the authorization is terminated or revoked.     Resp Syncytial Virus by PCR NEGATIVE NEGATIVE Final    Comment: (NOTE) Fact Sheet for Patients: BloggerCourse.com  Fact Sheet for Healthcare Providers: SeriousBroker.it  This test is not yet approved or cleared by the [place]  FDA and has been authorized for detection and/or diagnosis of SARS-CoV-2 by FDA under an Emergency Use Authorization (EUA). This EUA will remain in effect (meaning this test can be used) for the duration of the COVID-19 declaration under Section 564(b)(1) of the Act, 21 U.S.C. section 360bbb-3(b)(1), unless the authorization is terminated or revoked.  Performed at [facility], [address], [place], [place] [address]   MRSA Next Gen by PCR, Nasal     Status: None   Collection Time: 03/13/24  8:23 AM   Specimen: Nasal Mucosa; Nasal Swab  Result Value Ref Range Status   MRSA by PCR Next Gen NOT DETECTED NOT DETECTED Final    Comment: (NOTE) The GeneXpert MRSA Assay (FDA approved for NASAL specimens only), is one component of Charna Neeb comprehensive MRSA colonization surveillance program. It is not intended to diagnose MRSA infection nor to guide or monitor treatment for MRSA infections. Test performance is not FDA approved in patients less  than 96 years old. Performed at [facility], [address], [place], [place] [address]   Urine Culture (for pregnant, neutropenic or urologic patients or patients with an indwelling urinary catheter)     Status: None   Collection Time: 03/13/24 10:55 AM   Specimen: Urine, Clean Catch  Result Value Ref Range Status   Specimen Description URINE, CLEAN CATCH  Final   Special Requests NONE  Final   Culture   Final    NO GROWTH Performed at [facility], [address] [place], [place] [address]    Report Status 03/14/2024 FINAL  Final  Respiratory (~20 pathogens) panel by PCR     Status: None   Collection Time: 03/13/24  6:20 PM   Specimen: Nasopharyngeal Swab; Respiratory  Result Value Ref Range Status   Adenovirus NOT DETECTED NOT DETECTED Final   Coronavirus 229E NOT DETECTED NOT DETECTED Final    Comment: (NOTE) The Coronavirus on the Respiratory Panel, DOES NOT test for the novel  Coronavirus (2019 nCoV)    Coronavirus HKU1 NOT DETECTED NOT DETECTED Final   Coronavirus NL63 NOT DETECTED NOT DETECTED Final   Coronavirus OC43 NOT DETECTED NOT DETECTED Final   Metapneumovirus NOT DETECTED NOT DETECTED Final   Rhinovirus / Enterovirus NOT DETECTED NOT DETECTED Final   Influenza Dorsie Sethi NOT DETECTED NOT DETECTED Final   Influenza B NOT DETECTED NOT DETECTED Final   Parainfluenza Virus 1 NOT DETECTED NOT DETECTED Final   Parainfluenza Virus 2 NOT DETECTED NOT DETECTED Final   Parainfluenza Virus 3 NOT DETECTED NOT DETECTED Final   Parainfluenza Virus 4 NOT  DETECTED NOT DETECTED Final   Respiratory Syncytial Virus NOT DETECTED NOT DETECTED Final   Bordetella pertussis NOT DETECTED NOT DETECTED Final   Bordetella Parapertussis NOT DETECTED NOT DETECTED Final   Chlamydophila pneumoniae NOT DETECTED NOT DETECTED Final   Mycoplasma pneumoniae NOT DETECTED NOT DETECTED Final    Comment: Performed at [facility], [address], [place], [place] [address]          Radiology Studies: ECHOCARDIOGRAM COMPLETE Result Date: 03/14/2024    ECHOCARDIOGRAM REPORT   Patient Name:   Daniel Velez Date of Exam: 03/14/2024 Medical Rec #:  [id number]      Height:       72.0 in Accession #:    [id number]     Weight:       223.9 lb Date of Birth:  01/17/1961     BSA:          2.235 m Patient Age:    62 years       BP:           116/99 mmHg Patient Gender: M              HR:           104 bpm. Exam Location:  Inpatient Procedure: 2D Echo, Cardiac Doppler, Color Doppler and 3D Echo (Both Spectral            and Color Flow Doppler were utilized during procedure). Indications:    Other abnormalities of the heart  History:        Patient has no prior history of Echocardiogram examinations.  Sonographer:    Philomena Daring Referring Phys: [phone] Roxy Filler CALDWELL POWELL JR IMPRESSIONS  1. Left ventricular ejection fraction, by estimation, is 55 to 60%. Left ventricular ejection fraction by 3D volume is 58 %. The left ventricle has normal function. The left ventricle has no regional wall motion abnormalities. Left ventricular diastolic  parameters were normal.  2. Right ventricular systolic function is normal. The right ventricular size is normal. Tricuspid regurgitation signal is inadequate for assessing PA pressure.  3. The mitral valve is degenerative. Mild mitral valve regurgitation. No evidence of mitral stenosis.  4. The aortic valve is tricuspid. Aortic valve regurgitation is not visualized. Aortic valve sclerosis/calcification is present, without any evidence of aortic stenosis.  5. The inferior vena cava is normal in size with greater than 50% respiratory variability, suggesting right atrial pressure of 3 mmHg. FINDINGS  Left Ventricle: Left ventricular ejection fraction, by estimation, is 55 to 60%. Left ventricular ejection fraction by 3D volume is 58 %. The left ventricle has normal function. The left ventricle has no regional wall motion abnormalities. The left ventricular internal  cavity size was normal in size. There is no left ventricular hypertrophy. Left ventricular diastolic parameters were normal. Normal left ventricular filling pressure. Right Ventricle: The right ventricular size is normal. No increase in right ventricular wall thickness. Right ventricular systolic function is normal. Tricuspid regurgitation signal is inadequate for assessing PA pressure. Left Atrium: Left atrial size was normal in size. Right Atrium: Right atrial size was normal in size. Pericardium: There is no evidence of pericardial effusion. Mitral Valve: The mitral valve is degenerative in appearance. There is mild calcification of the mitral valve leaflet(s). Mild mitral valve regurgitation. No evidence of mitral valve stenosis. Tricuspid Valve: The tricuspid valve is normal in structure. Tricuspid valve regurgitation is trivial. No evidence of tricuspid stenosis. Aortic Valve: The aortic valve is tricuspid. Aortic valve  regurgitation is not visualized. Aortic valve sclerosis/calcification is present, without any evidence of aortic stenosis. Pulmonic Valve: The pulmonic valve was normal in structure. Pulmonic valve regurgitation is trivial. No evidence of pulmonic stenosis. Aorta: The aortic root is normal in size and structure. Venous: The inferior vena cava is normal in size with greater than 50% respiratory variability, suggesting right atrial pressure of 3 mmHg. IAS/Shunts: No atrial level shunt detected by color flow Doppler. Additional Comments: 3D was performed not requiring image post processing on an independent workstation and was normal.  LEFT VENTRICLE PLAX 2D LVIDd:         5.74 cm         Diastology LVIDs:         3.74 cm         LV e' medial:   10.90 cm/s LV PW:         0.95 cm         LV E/e' medial: 8.2 LV IVS:        0.86 cm LVOT diam:     2.30 cm LV SV:         83              3D Volume EF LV SV Index:   37              LV 3D EF:    Left LVOT Area:     4.15 cm                     ventricul                                              ar                                             ejection                                             fraction                                             by 3D                                             volume is                                             58 %.                                 3D Volume EF:  3D EF:        58 %                                LV EDV:       157 ml                                LV ESV:       65 ml                                LV SV:        92 ml RIGHT VENTRICLE             IVC RV Basal diam:  3.38 cm     IVC diam: 1.57 cm RV Mid diam:    3.08 cm RV S prime:     17.50 cm/s TAPSE (M-mode): 2.5 cm LEFT ATRIUM             Index        RIGHT ATRIUM           Index LA diam:        3.71 cm 1.66 cm/m   RA Area:     15.20 cm LA Vol (A2C):   36.0 ml 16.11 ml/m  RA Volume:   36.70 ml  16.42 ml/m LA Vol (A4C):   45.4 ml 20.31 ml/m LA Biplane Vol: 41.5 ml 18.57 ml/m  AORTIC VALVE LVOT Vmax:   119.00 cm/s LVOT Vmean:  83.800 cm/s LVOT VTI:    0.200 m  AORTA Ao Root diam: 3.28 cm Ao Asc diam:  3.61 cm MITRAL VALVE MV Area (PHT): 4.68 cm    SHUNTS MV Decel Time: 162 msec    Systemic VTI:  0.20 m MV E velocity: 89.10 cm/s  Systemic Diam: 2.30 cm MV Thornton Dohrmann velocity: 80.70 cm/s MV E/Sandie Swayze ratio:  1.10 Wilbert Bihari MD Electronically signed by Wilbert Bihari MD Signature Date/Time: 03/14/2024/4:43:25 PM    Final    CT CHEST WO CONTRAST Result Date: 03/13/2024 CLINICAL DATA:  Respiratory illness, nondiagnostic x-ray. EXAM: CT CHEST WITHOUT CONTRAST TECHNIQUE: Multidetector CT imaging of the chest was performed following the standard protocol without IV contrast. RADIATION DOSE REDUCTION: This exam was performed according to the departmental dose-optimization program which includes automated exposure control, adjustment of the mA and/or kV according to patient size and/or use of iterative reconstruction technique. COMPARISON:   03/13/2024. FINDINGS: Cardiovascular: The heart is normal in size and there is Lachandra Dettmann trace pericardial effusion. The aorta and pulmonary trunk are normal in caliber. Mediastinum/Nodes: No mediastinal or axillary lymphadenopathy is seen. Evaluation of the hila is limited due to lack of IV contrast. The thyroid gland, trachea, and esophagus are within normal limits. Lungs/Pleura: There is Shemar Plemmons trace right pleural effusion. Patchy airspace disease with consolidation is present in the right upper lobe. The left lung is clear. No pneumothorax is seen. Upper Abdomen: Cayton Cuevas scattered hypodensities are noted in the liver, most consistent with cysts. Stones are present within the gallbladder. No acute abnormality. Musculoskeletal: Degenerative changes are present in the thoracic spine. No acute osseous abnormality is seen. Cervical spinal fusion hardware is noted. IMPRESSION: 1. Patchy airspace disease and consolidation in the right upper lobe suggesting pneumonia. Continue follow-up is recommended until resolution. 2. Cholelithiasis. Electronically Signed  By: Leita Birmingham M.D.   On: 03/13/2024 18:27        Scheduled Meds:  acetaminophen   1,000 mg Oral Q8H   allopurinol   300 mg Oral Daily   azithromycin   500 mg Oral Daily   enoxaparin  (LOVENOX ) injection  40 mg Subcutaneous Q24H   guaiFENesin   15 mL Oral BID   pantoprazole  (PROTONIX ) IV  40 mg Intravenous Q24H   sodium chloride  flush  3 mL Intravenous Q12H   Continuous Infusions:  cefTRIAXone  (ROCEPHIN )  IV Stopped (03/14/24 0917)   lactated ringers  150 mL/hr at 03/15/24 0740     LOS: 2 days    Time spent: over 30 min     Meliton Monte, MD [facility]   To contact the attending provider between 7A-7P or the covering provider during after hours 7P-7A, please log into the web site [email or web address] and access using universal [place] password for that web site. If you do not have the password, please call the hospital operator.  03/15/2024,  9:39 AM

## 2024-03-16 DIAGNOSIS — A481 Legionnaires' disease: Secondary | ICD-10-CM | POA: Diagnosis not present

## 2024-03-16 DIAGNOSIS — M6282 Rhabdomyolysis: Secondary | ICD-10-CM

## 2024-03-16 DIAGNOSIS — N179 Acute kidney failure, unspecified: Secondary | ICD-10-CM | POA: Insufficient documentation

## 2024-03-16 DIAGNOSIS — A419 Sepsis, unspecified organism: Secondary | ICD-10-CM | POA: Diagnosis not present

## 2024-03-16 DIAGNOSIS — E872 Acidosis, unspecified: Secondary | ICD-10-CM | POA: Insufficient documentation

## 2024-03-16 DIAGNOSIS — N2889 Other specified disorders of kidney and ureter: Secondary | ICD-10-CM

## 2024-03-16 DIAGNOSIS — D509 Iron deficiency anemia, unspecified: Secondary | ICD-10-CM | POA: Insufficient documentation

## 2024-03-16 DIAGNOSIS — E66811 Obesity, class 1: Secondary | ICD-10-CM | POA: Insufficient documentation

## 2024-03-16 DIAGNOSIS — R6520 Severe sepsis without septic shock: Secondary | ICD-10-CM

## 2024-03-16 LAB — COMPREHENSIVE METABOLIC PANEL WITH GFR
ALT: 71 U/L — ABNORMAL HIGH (ref 0–44)
AST: 140 U/L — ABNORMAL HIGH (ref 15–41)
Albumin: 2.2 g/dL — ABNORMAL LOW (ref 3.5–5.0)
Alkaline Phosphatase: 44 U/L (ref 38–126)
Anion gap: 9 (ref 5–15)
BUN: 20 mg/dL (ref 8–23)
CO2: 21 mmol/L — ABNORMAL LOW (ref 22–32)
Calcium: 8.2 mg/dL — ABNORMAL LOW (ref 8.9–10.3)
Chloride: 106 mmol/L (ref 98–111)
Creatinine, Ser: 1.33 mg/dL — ABNORMAL HIGH (ref 0.61–1.24)
GFR, Estimated: >60 mL/min (ref >60)
Glucose, Bld: 102 mg/dL — ABNORMAL HIGH (ref 70–99)
Potassium: 3.6 mmol/L (ref 3.5–5.1)
Sodium: 136 mmol/L (ref 135–145)
Total Bilirubin: 1.3 mg/dL — ABNORMAL HIGH (ref 0.0–1.2)
Total Protein: 5.4 g/dL — ABNORMAL LOW (ref 6.5–8.1)

## 2024-03-16 LAB — CBC WITH DIFFERENTIAL/PLATELET
Abs Immature Granulocytes: 0.34 K/uL — ABNORMAL HIGH (ref 0.00–0.07)
Basophils Absolute: 0.1 K/uL (ref 0.0–0.1)
Basophils Relative: 1 %
Eosinophils Absolute: 0.3 K/uL (ref 0.0–0.5)
Eosinophils Relative: 3 %
HCT: 22 % — ABNORMAL LOW (ref 39.0–52.0)
Hemoglobin: 7.7 g/dL — ABNORMAL LOW (ref 13.0–17.0)
Immature Granulocytes: 4 %
Lymphocytes Relative: 20 %
Lymphs Abs: 1.8 K/uL (ref 0.7–4.0)
MCH: 29.8 pg (ref 26.0–34.0)
MCHC: 35 g/dL (ref 30.0–36.0)
MCV: 85.3 fL (ref 80.0–100.0)
Monocytes Absolute: 1.5 K/uL — ABNORMAL HIGH (ref 0.1–1.0)
Monocytes Relative: 16 %
Neutro Abs: 5.1 K/uL (ref 1.7–7.7)
Neutrophils Relative %: 56 %
Platelets: 302 K/uL (ref 150–400)
RBC: 2.58 MIL/uL — ABNORMAL LOW (ref 4.22–5.81)
RDW: 13.2 % (ref 11.5–15.5)
WBC: 9.1 K/uL (ref 4.0–10.5)
nRBC: 0 % (ref 0.0–0.2)

## 2024-03-16 LAB — C-REACTIVE PROTEIN: CRP: 20.4 mg/dL — ABNORMAL HIGH (ref <1.0)

## 2024-03-16 LAB — CK: Total CK: 2748 U/L — ABNORMAL HIGH (ref 49–397)

## 2024-03-16 LAB — PHOSPHORUS: Phosphorus: 3.5 mg/dL (ref 2.5–4.6)

## 2024-03-16 LAB — MAGNESIUM: Magnesium: 1.8 mg/dL (ref 1.7–2.4)

## 2024-03-16 MED ORDER — LEVOFLOXACIN 750 MG PO TABS: 750.0000 mg | ORAL_TABLET | Freq: Every day | ORAL | 0 refills | Status: AC

## 2024-03-16 MED ORDER — LEVOFLOXACIN 750 MG PO TABS: 750.0000 mg | ORAL_TABLET | Freq: Every day | ORAL | Status: DC

## 2024-03-16 NOTE — Assessment & Plan Note (Addendum)
 03-16-2024 unclear if this is acute on chronic kidney disease. Admitted with Scr of 1.53. was placed on IVF. Scr down to 1.33. no other records to review. Pt will need repeat BMP in office after he completes ABX. I suspect that he has CKD.

## 2024-03-16 NOTE — Discharge Instructions (Signed)
 1. Follow up with your primary care provider in 1-2 weeks following discharge from hospital. 2. Seen your PCP for further evaluation of your iron deficiency. If not already scheduled, please get a colon cancer screening colonoscopy. Your PCP can refer you to a GI specialist to get this accomplished.

## 2024-03-16 NOTE — Assessment & Plan Note (Signed)
Body mass index is 30.75 kg/m².

## 2024-03-16 NOTE — Plan of Care (Signed)

## 2024-03-16 NOTE — Assessment & Plan Note (Addendum)
 03-16-2024 pt has iron deficiency anemia. After pt's pneumonia has been treated, pt needs to see his PCP for routine cancer screening. He will need colon cancer screening.  Iron/TIBC/Ferritin/ %Sat    Component Value Date/Time   IRON 27 (L) 03/14/2024 0940   TIBC 216 (L) 03/14/2024 0940   FERRITIN 3,677 (H) 03/14/2024 0940   IRONPCTSAT 13 (L) 03/14/2024 0940

## 2024-03-16 NOTE — Hospital Course (Addendum)
 CC: hiccups HPI: Daniel Velez is a 63 y.o. male with medical history significant of hypertension, mild chronic kidney disease, erectile dysfunction and remote history of renal calculi 44 years prior.  Patient reports a 1 week history of nonproductive coughing.  Went to his PCP for further evaluation.  Was COVID-negative and was given IM injection of steroids and symptom management medications consisting of cough suppressant.  Was not febrile at that time.  Patient states he did not feel like he was running fevers at home but also did not check.  Unfortunately cough has persisted and he initially presented to the ED apparently complaining of hiccups for 2 to 3 days.  He reported today EDP that he felt dehydrated and tired and worsening symptoms.  In the ED he had a temperature of 101.1 with mild tachycardia.  Poor oral intake.  Urinalysis was abnormal and concerning for UTI.  CK elevated greater than 4000.  Mild transaminitis.  Sodium low at 121, potassium low at 2.9, white count elevated at 14.7, BUN 28 creatinine 1.53 with an elevated anion gap of 17.  Normal lactic acid.  CT abdomen and pelvis revealed right pyelonephritis as well as mild to moderate left hydronephrosis.  He was also found to have an enlarged prostate gland.  Hospitalist service was consulted to evaluate the patient for admission.   Upon our evaluation of the patient he was still having issues with coughing which was nonproductive.  ED did repeat PCR testing and he was once again negative for COVID as well as for RSV and influenza.  Due to concerns over possible sepsis EDP gave this patient IV Maxipime , Flagyl  and vancomycin .  He has received 1 L of IV fluids in the ED.  After fluids were administered urine osmolality was ordered by this team and was found to be low at 272.  Follow-up CPK is pending.  After treatment with fluids and both IV and oral potassium patient's potassium has increased to 3.3, sodium has increased to 123, BUN has  decreased to 22 and creatinine has decreased to 1.43.  Blood cultures have been obtained in the ED.  Patient reported to this writer that he has had dark urine for the past 3 to 4 days.    Significant Events: Admitted 03/12/2024 for CAP   Admission Labs: Lactic acid 1.7 Na 121, K 2.9, CO2 of 18, BUN 28, Scr 1.53, glu 123 WBC 14.7, HgB 10.1, plt 278 INR 1.1 CK 4152 Covid/RSV/flu negative Urine Na 27, urine osm 469, serum Osm 272  Admission Imaging Studies: CXR Moderate to marked severity right upper lobe infiltrate. Follow-up to resolution is recommended to exclude the presence of an underlying neoplastic process. CT abd/pelvis Cholelithiasis. 2. Mild to moderate severity left-sided hydronephrosis without evidence of obstructing renal calculi. 3. Bilateral simple renal cysts. No follow-up imaging is recommended. This recommendation follows ACR consensus guidelines: Management of the Incidental Renal Mass on CT: A White Paper of the ACR Incidental Findings Committee. J Am Coll Radiol 2018;15:264-273. 4. Mild right perinephric inflammatory fat stranding which may represent sequelae associated with acute pyelonephritis. Correlation with urinalysis is recommended. 5. Sigmoid diverticulosis. 6. Grade 2 anterolisthesis of the L5 vertebral body on S1. 7. Enlarged prostate gland. Correlation with PSA levels is recommended. 8. Aortic  Atherosclerosis CT chest Patchy airspace disease and consolidation in the right upper lobe suggesting pneumonia. Continue follow-up is recommended until resolution. 2. Cholelithiasis.  Significant Labs: 03-13-2024 Strep Pneumo antigen Negative 03-13-2024 Legionella Antigen POSITIVE 03-13-2024 procalcitonin 2.44  03-13-2024 respiratory viral panel Negative 03-14-2024 vit B12 of 774  Significant Imaging Studies:   Antibiotic Therapy: Anti-infectives (From admission, onward)    Start     Dose/Rate Route Frequency Ordered Stop   03/13/24 1000  cefTRIAXone  (ROCEPHIN )  2 g in sodium chloride  0.9 % 100 mL IVPB       Note to Pharmacy: Retime as needed   2 g 200 mL/hr over 30 Minutes Intravenous Every 24 hours 03/13/24 0822     03/13/24 1000  azithromycin  (ZITHROMAX ) tablet 500 mg        500 mg Oral Daily 03/13/24 0822 03/18/24 0959   03/13/24 0245  vancomycin  (VANCOCIN ) IVPB 1000 mg/200 mL premix       Placed in Followed by Linked Group   1,000 mg 200 mL/hr over 60 Minutes Intravenous  Once 03/13/24 0142 03/13/24 0415   03/13/24 0145  ceFEPIme  (MAXIPIME ) 2 g in sodium chloride  0.9 % 100 mL IVPB        2 g 200 mL/hr over 30 Minutes Intravenous  Once 03/13/24 0137 03/13/24 0252   03/13/24 0145  metroNIDAZOLE  (FLAGYL ) IVPB 500 mg        500 mg 100 mL/hr over 60 Minutes Intravenous  Once 03/13/24 0137 03/13/24 0400   03/13/24 0145  vancomycin  (VANCOCIN ) IVPB 1000 mg/200 mL premix  Status:  Discontinued        1,000 mg 200 mL/hr over 60 Minutes Intravenous  Once 03/13/24 0137 03/13/24 0142   03/13/24 0145  vancomycin  (VANCOCIN ) IVPB 1000 mg/200 mL premix       Placed in Followed by Linked Group   1,000 mg 200 mL/hr over 60 Minutes Intravenous  Once 03/13/24 0142 03/13/24 0309      Procedures:   Consultants: urology

## 2024-03-16 NOTE — Plan of Care (Signed)

## 2024-03-16 NOTE — Discharge Summary (Signed)
 [facility] Phys[facility] Discharge Summary   Patient name: Daniel Velez  Admit date:     03/12/2024  Discharge date: 03/16/2024  Attending Physician: PATSY LENIS [phone]  Discharge Physician: Camellia Door   PCP: Larnell Hamilton, MD  Admitted From: Home   Disposition:  Home  Recommendations for Outpatient Follow-up:  1. Follow up with your primary care provider in 1-2 weeks following discharge from hospital. 2. Seen your PCP for further evaluation of your iron deficiency. If not already scheduled, please get a colon cancer screening colonoscopy. Your PCP can refer you to a GI specialist to get this accomplished.  Home Health:No Equipment/Devices: None    Discharge Condition:Stable CODE STATUS:FULL Diet recommendation: Heart Healthy Fluid Restriction: None  Hospital Summary: CC: hiccups HPI: DAEMIAN Velez is a 63 y.o. male with medical history significant of hypertension, mild chronic kidney disease, erectile dysfunction and remote history of renal calculi 44 years prior.  Patient reports a 1 week history of nonproductive coughing.  Went to his PCP for further evaluation.  Was COVID-negative and was given IM injection of steroids and symptom management medications consisting of cough suppressant.  Was not febrile at that time.  Patient states he did not feel like he was running fevers at home but also did not check.  Unfortunately cough has persisted and he initially presented to the ED apparently complaining of hiccups for 2 to 3 days.  He reported today EDP that he felt dehydrated and tired and worsening symptoms.  In the ED he had a temperature of 101.1 with mild tachycardia.  Poor oral intake.  Urinalysis was abnormal and concerning for UTI.  CK elevated greater than 4000.  Mild transaminitis.  Sodium low at 121, potassium low at 2.9, white count elevated at 14.7, BUN 28 creatinine 1.53 with an elevated anion gap of 17.  Normal lactic acid.  CT abdomen and pelvis revealed  right pyelonephritis as well as mild to moderate left hydronephrosis.  He was also found to have an enlarged prostate gland.  Hospitalist service was consulted to evaluate the patient for admission.   Upon our evaluation of the patient he was still having issues with coughing which was nonproductive.  ED did repeat PCR testing and he was once again negative for COVID as well as for RSV and influenza.  Due to concerns over possible sepsis EDP gave this patient IV Maxipime , Flagyl  and vancomycin .  He has received 1 L of IV fluids in the ED.  After fluids were administered urine osmolality was ordered by this team and was found to be low at 272.  Follow-up CPK is pending.  After treatment with fluids and both IV and oral potassium patient's potassium has increased to 3.3, sodium has increased to 123, BUN has decreased to 22 and creatinine has decreased to 1.43.  Blood cultures have been obtained in the ED.  Patient reported to this writer that he has had dark urine for the past 3 to 4 days.    Significant Events: Admitted 03/12/2024 for CAP   Admission Labs: Lactic acid 1.7 Na 121, K 2.9, CO2 of 18, BUN 28, Scr 1.53, glu 123 WBC 14.7, HgB 10.1, plt 278 INR 1.1 CK 4152 Covid/RSV/flu negative Urine Na 27, urine osm 469, serum Osm 272  Admission Imaging Studies: CXR Moderate to marked severity right upper lobe infiltrate. Follow-up to resolution is recommended to exclude the presence of an underlying neoplastic process. CT abd/pelvis Cholelithiasis. 2. Mild to moderate severity left-sided hydronephrosis without  evidence of obstructing renal calculi. 3. Bilateral simple renal cysts. No follow-up imaging is recommended. This recommendation follows ACR consensus guidelines: Management of the Incidental Renal Mass on CT: A White Paper of the ACR Incidental Findings Committee. J Am Coll Radiol 2018;15:264-273. 4. Mild right perinephric inflammatory fat stranding which may represent sequelae associated with  acute pyelonephritis. Correlation with urinalysis is recommended. 5. Sigmoid diverticulosis. 6. Grade 2 anterolisthesis of the L5 vertebral body on S1. 7. Enlarged prostate gland. Correlation with PSA levels is recommended. 8. Aortic  Atherosclerosis CT chest Patchy airspace disease and consolidation in the right upper lobe suggesting pneumonia. Continue follow-up is recommended until resolution. 2. Cholelithiasis.  Significant Labs: 03-13-2024 Strep Pneumo antigen Negative 03-13-2024 Legionella Antigen POSITIVE 03-13-2024 procalcitonin 2.44 03-13-2024 respiratory viral panel Negative 03-14-2024 vit B12 of 774  Significant Imaging Studies:   Antibiotic Therapy: Anti-infectives (From admission, onward)    Start     Dose/Rate Route Frequency Ordered Stop   03/13/24 1000  cefTRIAXone  (ROCEPHIN ) 2 g in sodium chloride  0.9 % 100 mL IVPB       Note to Pharmacy: Retime as needed   2 g 200 mL/hr over 30 Minutes Intravenous Every 24 hours 03/13/24 0822     03/13/24 1000  azithromycin  (ZITHROMAX ) tablet 500 mg        500 mg Oral Daily 03/13/24 0822 03/18/24 0959   03/13/24 0245  vancomycin  (VANCOCIN ) IVPB 1000 mg/200 mL premix       Placed in Followed by Linked Group   1,000 mg 200 mL/hr over 60 Minutes Intravenous  Once 03/13/24 0142 03/13/24 0415   03/13/24 0145  ceFEPIme  (MAXIPIME ) 2 g in sodium chloride  0.9 % 100 mL IVPB        2 g 200 mL/hr over 30 Minutes Intravenous  Once 03/13/24 0137 03/13/24 0252   03/13/24 0145  metroNIDAZOLE  (FLAGYL ) IVPB 500 mg        500 mg 100 mL/hr over 60 Minutes Intravenous  Once 03/13/24 0137 03/13/24 0400   03/13/24 0145  vancomycin  (VANCOCIN ) IVPB 1000 mg/200 mL premix  Status:  Discontinued        1,000 mg 200 mL/hr over 60 Minutes Intravenous  Once 03/13/24 0137 03/13/24 0142   03/13/24 0145  vancomycin  (VANCOCIN ) IVPB 1000 mg/200 mL premix       Placed in Followed by Linked Group   1,000 mg 200 mL/hr over 60 Minutes Intravenous  Once 03/13/24  0142 03/13/24 0309       Procedures:   Consultants: urology   Hospital Course by Problem: * Sepsis (HCC) 03-16-2024 present on admission to hospital. WBC 14.7, CXR shows pneumonia, HR 113.  Legionella pneumonia (HCC) 03-16-2024 on admission, treated as if he had CAP. Pt states he was in [facility] last week before he got sick. He was staying in a motel in the basement where the carpet smelled wet and moldy. He became ill soon after he left the motel. Pt has been on IV Rocephin /po zithromax  for the last several days. Legionella came back as positive yesterday. Will change over to po Levaquin  750 mg bid x 7 days.  Non-traumatic rhabdomyolysis 03-16-2024 CK initially 4152. Today, CK down to 2748. Advised pt to continue to drink plenty of water until the color of his urine is pale yellow. Avoid NSAIDs, diuretics and ACEI/ARB for now.  Obesity, Class I, BMI 30-34.9 Body mass index is 30.75 kg/m.   Iron deficiency anemia 03-16-2024 pt has iron deficiency anemia. After pt's pneumonia has  been treated, pt needs to see his PCP for routine cancer screening. He will need colon cancer screening.  Iron/TIBC/Ferritin/ %Sat    Component Value Date/Time   IRON 27 (L) 03/14/2024 0940   TIBC 216 (L) 03/14/2024 0940   FERRITIN 3,677 (H) 03/14/2024 0940   IRONPCTSAT 13 (L) 03/14/2024 0940     Metabolic acidosis, normal anion gap (NAG) 03-16-2024 on admission, he has serum CO2 of 18. Pt placed on Ivf. His metabolic acidosis resolved. Bicarb up to 21 today.  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  Pt seen by urology who disputed pt has hydronephrosis. More likely he has renal pelviectasis. He does not need urology f/u.  AKI (acute kidney injury) (HCC) 03-16-2024 unclear if this is acute on chronic kidney disease. Admitted with Scr of 1.53. was placed on IVF. Scr down to 1.33. no other records to review. Pt will need repeat BMP in office after he  completes ABX. I suspect that he has CKD.    Discharge Diagnoses:  Principal Problem:   Sepsis (HCC) Active Problems:   Legionella pneumonia (HCC)   Non-traumatic rhabdomyolysis   AKI (acute kidney injury) (HCC)   Renal pelviectasis   Metabolic acidosis, normal anion gap (NAG)   Iron deficiency anemia   Obesity, Class I, BMI 30-34.9   Discharge Instructions  Discharge Instructions     Call MD for:  difficulty breathing, headache or visual disturbances   Complete by: As directed    Call MD for:  hives   Complete by: As directed    Call MD for:  persistant dizziness or light-headedness   Complete by: As directed    Call MD for:  persistant nausea and vomiting   Complete by: As directed    Call MD for:  redness, tenderness, or signs of infection (pain, swelling, redness, odor or green/yellow discharge around incision site)   Complete by: As directed    Call MD for:  severe uncontrolled pain   Complete by: As directed    Call MD for:  temperature >100.4   Complete by: As directed    Diet - low sodium heart healthy   Complete by: As directed    Discharge instructions   Complete by: As directed    1. Follow up with your primary care provider in 1-2 weeks following discharge from hospital. 2. Seen your PCP for further evaluation of your iron deficiency. If not already scheduled, please get a colon cancer screening colonoscopy. Your PCP can refer you to a GI specialist to get this accomplished.   Increase activity slowly   Complete by: As directed       Allergies as of 03/16/2024   No Known Allergies      Medication List     PAUSE taking these medications    Benicar HCT 40-25 MG tablet Wait to take this until your doctor or other care provider tells you to start again. Hold until told to restart by your PCP Generic drug: olmesartan-hydrochlorothiazide Take 1 tablet by mouth daily.       TAKE these medications    allopurinol  300 MG tablet Commonly known as:  ZYLOPRIM  Take 300 mg by mouth daily.   Colchicine 0.6 MG Caps Take 0.6 mg by mouth as needed.   fluticasone 50 MCG/ACT nasal spray Commonly known as: FLONASE Place 1 spray into both nostrils 2 (two) times daily as needed.   levofloxacin  750 MG tablet Commonly known as: LEVAQUIN  Take 1 tablet (750 mg total)  by mouth daily for 7 days. Start taking on: March 17, 2024   methocarbamol  500 MG tablet Commonly known as: ROBAXIN  Take 500 mg by mouth every 8 (eight) hours as needed for muscle spasms.   OVER THE COUNTER MEDICATION daily. Walitin Allergy OTC   promethazine-dextromethorphan  6.25-15 MG/5ML syrup Commonly known as: PROMETHAZINE-DM Take 10 mLs by mouth every 4 (four) hours as needed for cough.        No Known Allergies  Discharge Exam: Vitals:   03/16/24 0801 03/16/24 1301  BP: 139/87 (!) 159/88  Pulse: (!) 103 (!) 105  Resp: 17 17  Temp: 98.3 F (36.8 C) 98.9 F (37.2 C)  SpO2: 98% 99%    Physical Exam Vitals and nursing note reviewed.  Constitutional:      General: He is not in acute distress.    Appearance: He is normal weight. He is not toxic-appearing or diaphoretic.  HENT:     Head: Normocephalic and atraumatic.     Nose: Nose normal.  Eyes:     General: No scleral icterus. Cardiovascular:     Rate and Rhythm: Normal rate and regular rhythm.     Pulses: Normal pulses.     Heart sounds: No murmur heard. Pulmonary:     Effort: Pulmonary effort is normal. No respiratory distress.     Breath sounds: No wheezing.  Abdominal:     General: Bowel sounds are normal.     Palpations: Abdomen is soft.  Musculoskeletal:     Right lower leg: No edema.     Left lower leg: No edema.  Skin:    General: Skin is warm and dry.     Capillary Refill: Capillary refill takes less than 2 seconds.  Neurological:     General: No focal deficit present.     Mental Status: He is alert and oriented to person, place, and time.     The results of significant  diagnostics from this hospitalization (including imaging, microbiology, ancillary and laboratory) are listed below for reference.    Microbiology: Recent Results (from the past 240 hours)  Blood Culture (routine x 2)     Status: None (Preliminary result)   Collection Time: 03/13/24 12:09 AM   Specimen: BLOOD  Result Value Ref Range Status   Specimen Description   Final    BLOOD RIGHT ANTECUBITAL Performed at [facility], [address], [place], [place] [address]    Special Requests   Final    BOTTLES DRAWN AEROBIC AND ANAEROBIC Blood Culture adequate volume Performed at [facility], [address] [place], [place] [address]    Culture   Final    NO GROWTH 3 DAYS Performed at [facility], [address], [place], [place] [address]    Report Status PENDING  Incomplete  Blood Culture (routine x 2)     Status: None (Preliminary result)   Collection Time: 03/13/24 12:09 AM   Specimen: BLOOD LEFT FOREARM  Result Value Ref Range Status   Specimen Description   Final    BLOOD LEFT FOREARM Performed at [facility], [address], [place], [place] [address]    Special Requests   Final    BOTTLES DRAWN AEROBIC AND ANAEROBIC Blood Culture adequate volume Performed at [facility], [address] [place], [place] [address]    Culture   Final    NO GROWTH 3 DAYS Performed at [facility], [address] [place], [place] [address]  Report Status PENDING  Incomplete  Resp panel by RT-PCR (RSV, Flu A&B, Covid) Anterior Nasal Swab     Status: None   Collection Time: 03/13/24  2:04 AM   Specimen: Anterior Nasal Swab  Result Value Ref Range Status   SARS Coronavirus 2 by RT PCR NEGATIVE NEGATIVE Final    Comment: (NOTE) SARS-CoV-2 target nucleic acids are NOT DETECTED.  The SARS-CoV-2 RNA is generally detectable in upper respiratory specimens during the acute phase of infection. The  lowest concentration of SARS-CoV-2 viral copies this assay can detect is 138 copies/mL. A negative result does not preclude SARS-Cov-2 infection and should not be used as the sole basis for treatment or other patient management decisions. A negative result may occur with  improper specimen collection/handling, submission of specimen other than nasopharyngeal swab, presence of viral mutation(s) within the areas targeted by this assay, and inadequate number of viral copies(<138 copies/mL). A negative result must be combined with clinical observations, patient history, and epidemiological information. The expected result is Negative.  Fact Sheet for Patients:  BloggerCourse.com  Fact Sheet for Healthcare Providers:  SeriousBroker.it  This test is no t yet approved or cleared by the [place]  FDA and  has been authorized for detection and/or diagnosis of SARS-CoV-2 by FDA under an Emergency Use Authorization (EUA). This EUA will remain  in effect (meaning this test can be used) for the duration of the COVID-19 declaration under Section 564(b)(1) of the Act, 21 U.S.C.section 360bbb-3(b)(1), unless the authorization is terminated  or revoked sooner.       Influenza A by PCR NEGATIVE NEGATIVE Final   Influenza B by PCR NEGATIVE NEGATIVE Final    Comment: (NOTE) The Xpert Xpress SARS-CoV-2/FLU/RSV plus assay is intended as an aid in the diagnosis of influenza from Nasopharyngeal swab specimens and should not be used as a sole basis for treatment. Nasal washings and aspirates are unacceptable for Xpert Xpress SARS-CoV-2/FLU/RSV testing.  Fact Sheet for Patients: BloggerCourse.com  Fact Sheet for Healthcare Providers: SeriousBroker.it  This test is not yet approved or cleared by the [place]  FDA and has been authorized for detection and/or diagnosis of SARS-CoV-2 by FDA under  an Emergency Use Authorization (EUA). This EUA will remain in effect (meaning this test can be used) for the duration of the COVID-19 declaration under Section 564(b)(1) of the Act, 21 U.S.C. section 360bbb-3(b)(1), unless the authorization is terminated or revoked.     Resp Syncytial Virus by PCR NEGATIVE NEGATIVE Final    Comment: (NOTE) Fact Sheet for Patients: BloggerCourse.com  Fact Sheet for Healthcare Providers: SeriousBroker.it  This test is not yet approved or cleared by the [place]  FDA and has been authorized for detection and/or diagnosis of SARS-CoV-2 by FDA under an Emergency Use Authorization (EUA). This EUA will remain in effect (meaning this test can be used) for the duration of the COVID-19 declaration under Section 564(b)(1) of the Act, 21 U.S.C. section 360bbb-3(b)(1), unless the authorization is terminated or revoked.  Performed at [facility], [address] [place], [place] [address]   MRSA Next Gen by PCR, Nasal     Status: None   Collection Time: 03/13/24  8:23 AM   Specimen: Nasal Mucosa; Nasal Swab  Result Value Ref Range Status   MRSA by PCR Next Gen NOT DETECTED NOT DETECTED Final    Comment: (NOTE) The GeneXpert MRSA Assay (FDA approved for NASAL specimens only), is one component of a comprehensive MRSA colonization surveillance program. It is not intended  to diagnose MRSA infection nor to guide or monitor treatment for MRSA infections. Test performance is not FDA approved in patients less than 42 years old. Performed at [facility], [address], [place], [place] [address]   Urine Culture (for pregnant, neutropenic or urologic patients or patients with an indwelling urinary catheter)     Status: None   Collection Time: 03/13/24 10:55 AM   Specimen: Urine, Clean Catch  Result Value Ref Range Status   Specimen Description URINE, CLEAN CATCH  Final   Special  Requests NONE  Final   Culture   Final    NO GROWTH Performed at [facility], [address] [place], [place] [address]    Report Status 03/14/2024 FINAL  Final  Respiratory (~20 pathogens) panel by PCR     Status: None   Collection Time: 03/13/24  6:20 PM   Specimen: Nasopharyngeal Swab; Respiratory  Result Value Ref Range Status   Adenovirus NOT DETECTED NOT DETECTED Final   Coronavirus 229E NOT DETECTED NOT DETECTED Final    Comment: (NOTE) The Coronavirus on the Respiratory Panel, DOES NOT test for the novel  Coronavirus (2019 nCoV)    Coronavirus HKU1 NOT DETECTED NOT DETECTED Final   Coronavirus NL63 NOT DETECTED NOT DETECTED Final   Coronavirus OC43 NOT DETECTED NOT DETECTED Final   Metapneumovirus NOT DETECTED NOT DETECTED Final   Rhinovirus / Enterovirus NOT DETECTED NOT DETECTED Final   Influenza A NOT DETECTED NOT DETECTED Final   Influenza B NOT DETECTED NOT DETECTED Final   Parainfluenza Virus 1 NOT DETECTED NOT DETECTED Final   Parainfluenza Virus 2 NOT DETECTED NOT DETECTED Final   Parainfluenza Virus 3 NOT DETECTED NOT DETECTED Final   Parainfluenza Virus 4 NOT DETECTED NOT DETECTED Final   Respiratory Syncytial Virus NOT DETECTED NOT DETECTED Final   Bordetella pertussis NOT DETECTED NOT DETECTED Final   Bordetella Parapertussis NOT DETECTED NOT DETECTED Final   Chlamydophila pneumoniae NOT DETECTED NOT DETECTED Final   Mycoplasma pneumoniae NOT DETECTED NOT DETECTED Final    Comment: Performed at [facility], [address], [place], [place] [address]    Pneumonia Antigens  Component     Latest Ref Rng 03/13/2024 11:08 AM  L. pneumophila Serogp 1 Ur Ag     Negative  Positive !   Source of Sample URINE, RANDOM     Lab Results  Component Value Date/Time   [facility] NEGATIVE 03/13/2024 08:23 AM   Labs:  Basic Metabolic Panel: Recent Labs  Lab 03/13/24 0009 03/13/24 0831 03/13/24 1312 03/13/24 2014 03/14/24 0321 03/14/24 1847  03/15/24 0438 03/16/24 0254  NA 121*   < > 124*  --  127* 128* 133* 136  K 2.9*   < > 3.1*  --  3.6 3.5 3.6 3.6  CL 87*   < > 94*  --  99 99 103 106  CO2 18*   < > 19*  --  19* 18* 22 21*  GLUCOSE 123*   < > 141*  --  104* 106* 97 102*  BUN 28*   < > 22  --  23 24* 28* 20  CREATININE 1.53*   < > 1.45*  --  1.43* 1.45* 1.65* 1.33*  CALCIUM 8.6*   < > 8.3*  --  8.4* 8.3* 8.7* 8.2*  MG 1.7  --   --  1.6*  --   --  2.2 1.8  PHOS  --   --   --   --   --   --  3.4 3.5   < > = values in this interval not displayed.   Liver Function Tests: Recent Labs  Lab 03/13/24 0009 03/13/24 1459 03/14/24 0321 03/15/24 0438 03/16/24 0254  AST 119* 121* 176* 172* 140*  ALT 59* 53* 69* 74* 71*  ALKPHOS 74 49 55 52 44  BILITOT 4.0* 3.1* 3.2*  3.4* 1.8* 1.3*  PROT 7.4 5.9* 6.5 6.0* 5.4*  ALBUMIN 3.9 2.5* 2.6* 2.3* 2.2*   CBC: Recent Labs  Lab 03/13/24 0009 03/14/24 0321 03/15/24 0438 03/16/24 0254  WBC 14.7* 12.3* 8.7 9.1  NEUTROABS 10.6*  --  5.4 5.1  HGB 10.1* 8.8* 9.5* 7.7*  HCT 28.3* 24.7* 27.9* 22.0*  MCV 84.2 84.0 86.4 85.3  PLT 278 292 321 302   Cardiac Enzymes: Recent Labs  Lab 03/13/24 0009 03/13/24 1256 03/14/24 0321 03/15/24 0438 03/16/24 0254  CKTOTAL 4,152* 4,766* 5,854* 4,122* 2,748*   Anemia work up Recent Labs    03/14/24 0321 03/14/24 0940  VITAMINB12 774  --   FOLATE 10.7  --   FERRITIN  --  3,677*  TIBC  --  216*  IRON  --  27*  RETICCTPCT 1.6  --    Urinalysis    Component Value Date/Time   COLORURINE AMBER (A) 03/14/2024 0912   APPEARANCEUR CLOUDY (A) 03/14/2024 0912   LABSPEC 1.012 03/14/2024 0912   PHURINE 5.0 03/14/2024 0912   GLUCOSEU NEGATIVE 03/14/2024 0912   HGBUR LARGE (A) 03/14/2024 0912   BILIRUBINUR NEGATIVE 03/14/2024 0912   KETONESUR 5 (A) 03/14/2024 0912   PROTEINUR 100 (A) 03/14/2024 0912   NITRITE NEGATIVE 03/14/2024 0912   LEUKOCYTESUR NEGATIVE 03/14/2024 0912   Sepsis Labs Recent Labs  Lab 03/13/24 0009 03/13/24 1256  03/14/24 0321 03/15/24 0438 03/16/24 0254  PROCALCITON  --  2.44  --   --   --   WBC 14.7*  --  12.3* 8.7 9.1   Procedures/Studies: VAS US  LOWER EXTREMITY VENOUS (DVT) Result Date: 03/15/2024  Lower Venous DVT Study Patient Name:  HERSEY MACLELLAN Hlavacek  Date of Exam:   03/15/2024 Medical Rec #: [id number]       Accession #:    [id number] Date of Birth: 15-Mar-1961      Patient Gender: M Patient Age:   63 years Exam Location:  [facility] Procedure:      VAS US  LOWER EXTREMITY VENOUS (DVT) Referring Phys: A POWELL JR --------------------------------------------------------------------------------  Indications: Edema.  Risk Factors: None identified. Comparison Study: No prior studies. Performing Technologist: Cordella Collet RVT  Examination Guidelines: A complete evaluation includes B-mode imaging, spectral Doppler, color Doppler, and power Doppler as needed of all accessible portions of each vessel. Bilateral testing is considered an integral part of a complete examination. Limited examinations for reoccurring indications may be performed as noted. The reflux portion of the exam is performed with the patient in reverse Trendelenburg.  +---------+---------------+---------+-----------+----------+--------------+ RIGHT    CompressibilityPhasicitySpontaneityPropertiesThrombus Aging +---------+---------------+---------+-----------+----------+--------------+ CFV      Full           Yes      Yes                                 +---------+---------------+---------+-----------+----------+--------------+ SFJ      Full                                                        +---------+---------------+---------+-----------+----------+--------------+  FV Prox  Full                                                        +---------+---------------+---------+-----------+----------+--------------+ FV Mid   Full                                                         +---------+---------------+---------+-----------+----------+--------------+ FV DistalFull                                                        +---------+---------------+---------+-----------+----------+--------------+ PFV      Full                                                        +---------+---------------+---------+-----------+----------+--------------+ POP      Full           Yes      Yes                                 +---------+---------------+---------+-----------+----------+--------------+ PTV      Full                                                        +---------+---------------+---------+-----------+----------+--------------+ PERO     Full                                                        +---------+---------------+---------+-----------+----------+--------------+   +---------+---------------+---------+-----------+----------+--------------+ LEFT     CompressibilityPhasicitySpontaneityPropertiesThrombus Aging +---------+---------------+---------+-----------+----------+--------------+ CFV      Full           Yes      Yes                                 +---------+---------------+---------+-----------+----------+--------------+ SFJ      Full                                                        +---------+---------------+---------+-----------+----------+--------------+ FV Prox  Full                                                        +---------+---------------+---------+-----------+----------+--------------+  FV Mid   Full                                                        +---------+---------------+---------+-----------+----------+--------------+ FV DistalFull                                                        +---------+---------------+---------+-----------+----------+--------------+ PFV      Full                                                         +---------+---------------+---------+-----------+----------+--------------+ POP      Full           Yes      Yes                                 +---------+---------------+---------+-----------+----------+--------------+ PTV      Full                                                        +---------+---------------+---------+-----------+----------+--------------+ PERO     Full                                                        +---------+---------------+---------+-----------+----------+--------------+     Summary: RIGHT: - There is no evidence of deep vein thrombosis in the lower extremity.  - No cystic structure found in the popliteal fossa.  LEFT: - There is no evidence of deep vein thrombosis in the lower extremity.  - No cystic structure found in the popliteal fossa.  *See table(s) above for measurements and observations. Electronically signed by Gaile New MD on 03/15/2024 at 7:15:57 PM.    Final    ECHOCARDIOGRAM COMPLETE Result Date: 03/14/2024    ECHOCARDIOGRAM REPORT   Patient Name:   BRANCH PACITTI Motton Date of Exam: 03/14/2024 Medical Rec #:  [id number]      Height:       72.0 in Accession #:    [id number]     Weight:       223.9 lb Date of Birth:  11-Jul-1961     BSA:          2.235 m Patient Age:    62 years       BP:           116/99 mmHg Patient Gender: M              HR:           104 bpm. Exam Location:  Inpatient Procedure: 2D Echo, Cardiac Doppler, Color Doppler and 3D Echo (Both Spectral  and Color Flow Doppler were utilized during procedure). Indications:    Other abnormalities of the heart  History:        Patient has no prior history of Echocardiogram examinations.  Sonographer:    Philomena Daring Referring Phys: [phone] A CALDWELL POWELL JR IMPRESSIONS  1. Left ventricular ejection fraction, by estimation, is 55 to 60%. Left ventricular ejection fraction by 3D volume is 58 %. The left ventricle has normal function. The left ventricle has no regional wall motion  abnormalities. Left ventricular diastolic  parameters were normal.  2. Right ventricular systolic function is normal. The right ventricular size is normal. Tricuspid regurgitation signal is inadequate for assessing PA pressure.  3. The mitral valve is degenerative. Mild mitral valve regurgitation. No evidence of mitral stenosis.  4. The aortic valve is tricuspid. Aortic valve regurgitation is not visualized. Aortic valve sclerosis/calcification is present, without any evidence of aortic stenosis.  5. The inferior vena cava is normal in size with greater than 50% respiratory variability, suggesting right atrial pressure of 3 mmHg. FINDINGS  Left Ventricle: Left ventricular ejection fraction, by estimation, is 55 to 60%. Left ventricular ejection fraction by 3D volume is 58 %. The left ventricle has normal function. The left ventricle has no regional wall motion abnormalities. The left ventricular internal cavity size was normal in size. There is no left ventricular hypertrophy. Left ventricular diastolic parameters were normal. Normal left ventricular filling pressure. Right Ventricle: The right ventricular size is normal. No increase in right ventricular wall thickness. Right ventricular systolic function is normal. Tricuspid regurgitation signal is inadequate for assessing PA pressure. Left Atrium: Left atrial size was normal in size. Right Atrium: Right atrial size was normal in size. Pericardium: There is no evidence of pericardial effusion. Mitral Valve: The mitral valve is degenerative in appearance. There is mild calcification of the mitral valve leaflet(s). Mild mitral valve regurgitation. No evidence of mitral valve stenosis. Tricuspid Valve: The tricuspid valve is normal in structure. Tricuspid valve regurgitation is trivial. No evidence of tricuspid stenosis. Aortic Valve: The aortic valve is tricuspid. Aortic valve regurgitation is not visualized. Aortic valve sclerosis/calcification is present, without  any evidence of aortic stenosis. Pulmonic Valve: The pulmonic valve was normal in structure. Pulmonic valve regurgitation is trivial. No evidence of pulmonic stenosis. Aorta: The aortic root is normal in size and structure. Venous: The inferior vena cava is normal in size with greater than 50% respiratory variability, suggesting right atrial pressure of 3 mmHg. IAS/Shunts: No atrial level shunt detected by color flow Doppler. Additional Comments: 3D was performed not requiring image post processing on an independent workstation and was normal.  LEFT VENTRICLE PLAX 2D LVIDd:         5.74 cm         Diastology LVIDs:         3.74 cm         LV e' medial:   10.90 cm/s LV PW:         0.95 cm         LV E/e' medial: 8.2 LV IVS:        0.86 cm LVOT diam:     2.30 cm LV SV:         83              3D Volume EF LV SV Index:   37              LV 3D EF:    Left LVOT  Area:     4.15 cm                     ventricul                                             ar                                             ejection                                             fraction                                             by 3D                                             volume is                                             58 %.                                 3D Volume EF:                                3D EF:        58 %                                LV EDV:       157 ml                                LV ESV:       65 ml                                LV SV:        92 ml RIGHT VENTRICLE             IVC RV Basal diam:  3.38 cm     IVC diam: 1.57 cm RV Mid diam:    3.08 cm RV S prime:     17.50 cm/s TAPSE (M-mode): 2.5 cm LEFT ATRIUM             Index        RIGHT ATRIUM           Index LA diam:        3.71 cm 1.66 cm/m  RA Area:     15.20 cm LA Vol (A2C):   36.0 ml 16.11 ml/m  RA Volume:   36.70 ml  16.42 ml/m LA Vol (A4C):   45.4 ml 20.31 ml/m LA Biplane Vol: 41.5 ml 18.57 ml/m  AORTIC VALVE LVOT Vmax:   119.00 cm/s LVOT Vmean:   83.800 cm/s LVOT VTI:    0.200 m  AORTA Ao Root diam: 3.28 cm Ao Asc diam:  3.61 cm MITRAL VALVE MV Area (PHT): 4.68 cm    SHUNTS MV Decel Time: 162 msec    Systemic VTI:  0.20 m MV E velocity: 89.10 cm/s  Systemic Diam: 2.30 cm MV A velocity: 80.70 cm/s MV E/A ratio:  1.10 Wilbert Bihari MD Electronically signed by Wilbert Bihari MD Signature Date/Time: 03/14/2024/4:43:25 PM    Final    CT CHEST WO CONTRAST Result Date: 03/13/2024 CLINICAL DATA:  Respiratory illness, nondiagnostic x-ray. EXAM: CT CHEST WITHOUT CONTRAST TECHNIQUE: Multidetector CT imaging of the chest was performed following the standard protocol without IV contrast. RADIATION DOSE REDUCTION: This exam was performed according to the departmental dose-optimization program which includes automated exposure control, adjustment of the mA and/or kV according to patient size and/or use of iterative reconstruction technique. COMPARISON:  03/13/2024. FINDINGS: Cardiovascular: The heart is normal in size and there is a trace pericardial effusion. The aorta and pulmonary trunk are normal in caliber. Mediastinum/Nodes: No mediastinal or axillary lymphadenopathy is seen. Evaluation of the hila is limited due to lack of IV contrast. The thyroid gland, trachea, and esophagus are within normal limits. Lungs/Pleura: There is a trace right pleural effusion. Patchy airspace disease with consolidation is present in the right upper lobe. The left lung is clear. No pneumothorax is seen. Upper Abdomen: A scattered hypodensities are noted in the liver, most consistent with cysts. Stones are present within the gallbladder. No acute abnormality. Musculoskeletal: Degenerative changes are present in the thoracic spine. No acute osseous abnormality is seen. Cervical spinal fusion hardware is noted. IMPRESSION: 1. Patchy airspace disease and consolidation in the right upper lobe suggesting pneumonia. Continue follow-up is recommended until resolution. 2. Cholelithiasis.  Electronically Signed   By: Leita Birmingham M.D.   On: 03/13/2024 18:27   CT ABDOMEN PELVIS W CONTRAST Result Date: 03/13/2024 CLINICAL DATA:  Hiccups with elevated liver function tests and nonlocalized abdominal pain. EXAM: CT ABDOMEN AND PELVIS WITH CONTRAST TECHNIQUE: Multidetector CT imaging of the abdomen and pelvis was performed using the standard protocol following bolus administration of intravenous contrast. RADIATION DOSE REDUCTION: This exam was performed according to the departmental dose-optimization program which includes automated exposure control, adjustment of the mA and/or kV according to patient size and/or use of iterative reconstruction technique. CONTRAST:  100mL OMNIPAQUE  IOHEXOL  300 MG/ML  SOLN COMPARISON:  None Available. FINDINGS: Lower chest: No acute abnormality. Hepatobiliary: Several small cysts are seen within the left lobe of the liver. Subcentimeter gallstones are seen within the neck of a contracted gallbladder. There is no evidence of gallbladder wall thickening, biliary dilatation or pericholecystic inflammation. Pancreas: Unremarkable. No pancreatic ductal dilatation or surrounding inflammatory changes. Spleen: Normal in size without focal abnormality. Adrenals/Urinary Tract: Adrenal glands are unremarkable. Kidneys are normal in size without obstructing renal calculi. Mild to moderate severity left-sided hydronephrosis is seen. Bilateral simple renal cysts are also present. Cortical scarring is noted along the posteromedial aspect of the upper pole of the left kidney. Postoperative changes are also suspected within this region. Mild right-sided perinephric inflammatory fat stranding is seen. Bladder is  unremarkable. Stomach/Bowel: Stomach is within normal limits. Appendix appears normal. No evidence of bowel wall thickening, distention, or inflammatory changes. Noninflamed diverticula are seen within the proximal sigmoid colon Vascular/Lymphatic: Aortic atherosclerosis. No  enlarged abdominal or pelvic lymph nodes. Reproductive: The prostate gland is mildly enlarged. Other: No abdominal wall hernia or abnormality. No abdominopelvic ascites. Musculoskeletal: There is grade 2 anterolisthesis of the L5 vertebral body on S1. IMPRESSION: 1. Cholelithiasis. 2. Mild to moderate severity left-sided hydronephrosis without evidence of obstructing renal calculi. 3. Bilateral simple renal cysts. No follow-up imaging is recommended. This recommendation follows ACR consensus guidelines: Management of the Incidental Renal Mass on CT: A White Paper of the ACR Incidental Findings Committee. J Am Coll Radiol 2018;15:264-273. 4. Mild right perinephric inflammatory fat stranding which may represent sequelae associated with acute pyelonephritis. Correlation with urinalysis is recommended. 5. Sigmoid diverticulosis. 6. Grade 2 anterolisthesis of the L5 vertebral body on S1. 7. Enlarged prostate gland. Correlation with PSA levels is recommended. 8. Aortic atherosclerosis. Electronically Signed   By: Suzen Dials M.D.   On: 03/13/2024 02:55   DG Chest Port 1 View Result Date: 03/13/2024 CLINICAL DATA:  Hiccups x3 days. EXAM: PORTABLE CHEST 1 VIEW COMPARISON:  May 27, 2006 FINDINGS: The heart size and mediastinal contours are within normal limits. There is moderate to marked severity right upper lobe infiltrate. No pleural effusion or pneumothorax is identified. Postoperative changes are seen within the cervical spine. Multilevel degenerative changes are present throughout the thoracic spine. IMPRESSION: Moderate to marked severity right upper lobe infiltrate. Follow-up to resolution is recommended to exclude the presence of an underlying neoplastic process. Electronically Signed   By: Suzen Dials M.D.   On: 03/13/2024 00:28    Time coordinating discharge: 60 mins  SIGNED:  Camellia Door, DO [facility] 03/16/24, 1:52 PM

## 2024-03-16 NOTE — Progress Notes (Signed)
 PROGRESS NOTE    Daniel Velez  FMW:[id number] DOB: Nov 18, 1960 DOA: 03/12/2024 PCP: Larnell Hamilton, MD  Subjective: Pt seen and examined.  Pt states he was in [facility] last week before he got sick. He was staying in a motel in the basement where the carpet smelled wet and moldy. He became ill soon after he left the motel.   He wants to go home today.   Hospital Course: CC: hiccups HPI: Daniel Velez is a 63 y.o. male with medical history significant of hypertension, mild chronic kidney disease, erectile dysfunction and remote history of renal calculi 44 years prior.  Patient reports a 1 week history of nonproductive coughing.  Went to his PCP for further evaluation.  Was COVID-negative and was given IM injection of steroids and symptom management medications consisting of cough suppressant.  Was not febrile at that time.  Patient states he did not feel like he was running fevers at home but also did not check.  Unfortunately cough has persisted and he initially presented to the ED apparently complaining of hiccups for 2 to 3 days.  He reported today EDP that he felt dehydrated and tired and worsening symptoms.  In the ED he had a temperature of 101.1 with mild tachycardia.  Poor oral intake.  Urinalysis was abnormal and concerning for UTI.  CK elevated greater than 4000.  Mild transaminitis.  Sodium low at 121, potassium low at 2.9, white count elevated at 14.7, BUN 28 creatinine 1.53 with an elevated anion gap of 17.  Normal lactic acid.  CT abdomen and pelvis revealed right pyelonephritis as well as mild to moderate left hydronephrosis.  He was also found to have an enlarged prostate gland.  Hospitalist service was consulted to evaluate the patient for admission.   Upon our evaluation of the patient he was still having issues with coughing which was nonproductive.  ED did repeat PCR testing and he was once again negative for COVID as well as for RSV and influenza.  Due to concerns over  possible sepsis EDP gave this patient IV Maxipime , Flagyl  and vancomycin .  He has received 1 L of IV fluids in the ED.  After fluids were administered urine osmolality was ordered by this team and was found to be low at 272.  Follow-up CPK is pending.  After treatment with fluids and both IV and oral potassium patient's potassium has increased to 3.3, sodium has increased to 123, BUN has decreased to 22 and creatinine has decreased to 1.43.  Blood cultures have been obtained in the ED.  Patient reported to this writer that he has had dark urine for the past 3 to 4 days.    Significant Events: Admitted 03/12/2024 for CAP   Admission Labs: Lactic acid 1.7 Na 121, K 2.9, CO2 of 18, BUN 28, Scr 1.53, glu 123 WBC 14.7, HgB 10.1, plt 278 INR 1.1 CK 4152 Covid/RSV/flu negative Urine Na 27, urine osm 469, serum Osm 272  Admission Imaging Studies: CXR Moderate to marked severity right upper lobe infiltrate. Follow-up to resolution is recommended to exclude the presence of an underlying neoplastic process. CT abd/pelvis Cholelithiasis. 2. Mild to moderate severity left-sided hydronephrosis without evidence of obstructing renal calculi. 3. Bilateral simple renal cysts. No follow-up imaging is recommended. This recommendation follows ACR consensus guidelines: Management of the Incidental Renal Mass on CT: A White Paper of the ACR Incidental Findings Committee. J Am Coll Radiol 2018;15:264-273. 4. Mild right perinephric inflammatory fat stranding which may represent  sequelae associated with acute pyelonephritis. Correlation with urinalysis is recommended. 5. Sigmoid diverticulosis. 6. Grade 2 anterolisthesis of the L5 vertebral body on S1. 7. Enlarged prostate gland. Correlation with PSA levels is recommended. 8. Aortic  Atherosclerosis CT chest Patchy airspace disease and consolidation in the right upper lobe suggesting pneumonia. Continue follow-up is recommended until resolution. 2.  Cholelithiasis.  Significant Labs: 03-13-2024 Strep Pneumo antigen Negative 03-13-2024 Legionella Antigen POSITIVE 03-13-2024 procalcitonin 2.44 03-13-2024 respiratory viral panel Negative 03-14-2024 vit B12 of 774  Significant Imaging Studies:   Antibiotic Therapy: Anti-infectives (From admission, onward)    Start     Dose/Rate Route Frequency Ordered Stop   03/13/24 1000  cefTRIAXone  (ROCEPHIN ) 2 g in sodium chloride  0.9 % 100 mL IVPB       Note to Pharmacy: Retime as needed   2 g 200 mL/hr over 30 Minutes Intravenous Every 24 hours 03/13/24 0822     03/13/24 1000  azithromycin  (ZITHROMAX ) tablet 500 mg        500 mg Oral Daily 03/13/24 0822 03/18/24 0959   03/13/24 0245  vancomycin  (VANCOCIN ) IVPB 1000 mg/200 mL premix       Placed in Followed by Linked Group   1,000 mg 200 mL/hr over 60 Minutes Intravenous  Once 03/13/24 0142 03/13/24 0415   03/13/24 0145  ceFEPIme  (MAXIPIME ) 2 g in sodium chloride  0.9 % 100 mL IVPB        2 g 200 mL/hr over 30 Minutes Intravenous  Once 03/13/24 0137 03/13/24 0252   03/13/24 0145  metroNIDAZOLE  (FLAGYL ) IVPB 500 mg        500 mg 100 mL/hr over 60 Minutes Intravenous  Once 03/13/24 0137 03/13/24 0400   03/13/24 0145  vancomycin  (VANCOCIN ) IVPB 1000 mg/200 mL premix  Status:  Discontinued        1,000 mg 200 mL/hr over 60 Minutes Intravenous  Once 03/13/24 0137 03/13/24 0142   03/13/24 0145  vancomycin  (VANCOCIN ) IVPB 1000 mg/200 mL premix       Placed in Followed by Linked Group   1,000 mg 200 mL/hr over 60 Minutes Intravenous  Once 03/13/24 0142 03/13/24 0309       Procedures:   Consultants: urology    Assessment and Plan: * Sepsis (HCC) 03-16-2024 present on admission to hospital. WBC 14.7, CXR shows pneumonia, HR 113.  Legionella pneumonia (HCC) 03-16-2024 on admission, treated as if he had CAP. Pt states he was in [facility] last week before he got sick. He was staying in a motel in the basement where the carpet  smelled wet and moldy. He became ill soon after he left the motel. Pt has been on IV Rocephin /po zithromax  for the last several days. Legionella came back as positive yesterday. Will change over to po Levaquin  750 mg bid x 7 days.  Non-traumatic rhabdomyolysis 03-16-2024 CK initially 4152. Today, CK down to 2748. Advised pt to continue to drink plenty of water until the color of his urine is pale yellow. Avoid NSAIDs, diuretics and ACEI/ARB for now.  Obesity, Class I, BMI 30-34.9 Body mass index is 30.75 kg/m.   Iron deficiency anemia 03-16-2024 pt has iron deficiency anemia. After pt's pneumonia has been treated, pt needs to see his PCP for routine cancer screening. He will need colon cancer screening.  Iron/TIBC/Ferritin/ %Sat    Component Value Date/Time   IRON 27 (L) 03/14/2024 0940   TIBC 216 (L) 03/14/2024 0940   FERRITIN 3,677 (H) 03/14/2024 0940   IRONPCTSAT 13 (L)  03/14/2024 0940     Metabolic acidosis, normal anion gap (NAG) 03-16-2024 on admission, he has serum CO2 of 18. Pt placed on Ivf. His metabolic acidosis resolved. Bicarb up to 21 today.  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  Pt seen by urology who disputed pt has hydronephrosis. More likely he has renal pelviectasis. He does not need urology f/u.  AKI (acute kidney injury) (HCC) 03-16-2024 unclear if this is acute on chronic kidney disease. Admitted with Scr of 1.53. was placed on IVF. Scr down to 1.33. no other records to review. Pt will need repeat BMP in office after he completes ABX. I suspect that he has CKD.  DVT prophylaxis: enoxaparin  (LOVENOX ) injection 40 mg Start: 03/13/24 1100    Code Status: Full Code Family Communication: pt is decisional. Disposition Plan: return home Reason for continuing need for hospitalization: stable for DC  Objective: Vitals:   03/16/24 0318 03/16/24 0500 03/16/24 0801 03/16/24 1301  BP: 133/88  139/87 (!) 159/88  Pulse: (!)  104  (!) 103 (!) 105  Resp: 17  17 17   Temp: 97.9 F (36.6 C)  98.3 F (36.8 C) 98.9 F (37.2 C)  TempSrc: Oral     SpO2: 98%  98% 99%  Weight:  102.8 kg    Height:        Intake/Output Summary (Last 24 hours) at 03/16/2024 1345 Last data filed at 03/16/2024 0647 Gross per 24 hour  Intake 4460.45 ml  Output 2250 ml  Net 2210.45 ml   Filed Weights   03/14/24 0530 03/15/24 0606 03/16/24 0500  Weight: 101.6 kg 102.2 kg 102.8 kg    Examination:  Physical Exam Vitals and nursing note reviewed.  Constitutional:      General: He is not in acute distress.    Appearance: He is normal weight. He is not toxic-appearing or diaphoretic.  HENT:     Head: Normocephalic and atraumatic.     Nose: Nose normal.  Eyes:     General: No scleral icterus. Cardiovascular:     Rate and Rhythm: Normal rate and regular rhythm.     Pulses: Normal pulses.     Heart sounds: No murmur heard. Pulmonary:     Effort: Pulmonary effort is normal. No respiratory distress.     Breath sounds: No wheezing.  Abdominal:     General: Bowel sounds are normal.     Palpations: Abdomen is soft.  Musculoskeletal:     Right lower leg: No edema.     Left lower leg: No edema.  Skin:    General: Skin is warm and dry.     Capillary Refill: Capillary refill takes less than 2 seconds.  Neurological:     General: No focal deficit present.     Mental Status: He is alert and oriented to person, place, and time.     Data Reviewed: I have personally reviewed following labs and imaging studies  CBC: Recent Labs  Lab 03/13/24 0009 03/14/24 0321 03/15/24 0438 03/16/24 0254  WBC 14.7* 12.3* 8.7 9.1  NEUTROABS 10.6*  --  5.4 5.1  HGB 10.1* 8.8* 9.5* 7.7*  HCT 28.3* 24.7* 27.9* 22.0*  MCV 84.2 84.0 86.4 85.3  PLT 278 292 321 302   Basic Metabolic Panel: Recent Labs  Lab 03/13/24 0009 03/13/24 0831 03/13/24 1312 03/13/24 2014 03/14/24 0321 03/14/24 1847 03/15/24 0438 03/16/24 0254  NA 121*   < > 124*   --  127* 128* 133* 136  K 2.9*   < >  3.1*  --  3.6 3.5 3.6 3.6  CL 87*   < > 94*  --  99 99 103 106  CO2 18*   < > 19*  --  19* 18* 22 21*  GLUCOSE 123*   < > 141*  --  104* 106* 97 102*  BUN 28*   < > 22  --  23 24* 28* 20  CREATININE 1.53*   < > 1.45*  --  1.43* 1.45* 1.65* 1.33*  CALCIUM 8.6*   < > 8.3*  --  8.4* 8.3* 8.7* 8.2*  MG 1.7  --   --  1.6*  --   --  2.2 1.8  PHOS  --   --   --   --   --   --  3.4 3.5   < > = values in this interval not displayed.   GFR: Estimated Creatinine Clearance: 72.4 mL/min (A) (by C-G formula based on SCr of 1.33 mg/dL (H)). Liver Function Tests: Recent Labs  Lab 03/13/24 0009 03/13/24 1459 03/14/24 0321 03/15/24 0438 03/16/24 0254  AST 119* 121* 176* 172* 140*  ALT 59* 53* 69* 74* 71*  ALKPHOS 74 49 55 52 44  BILITOT 4.0* 3.1* 3.2*  3.4* 1.8* 1.3*  PROT 7.4 5.9* 6.5 6.0* 5.4*  ALBUMIN 3.9 2.5* 2.6* 2.3* 2.2*   Coagulation Profile: Recent Labs  Lab 03/13/24 0009  INR 1.1   Cardiac Enzymes: Recent Labs  Lab 03/13/24 0009 03/13/24 1256 03/14/24 0321 03/15/24 0438 03/16/24 0254  CKTOTAL 4,152* 4,766* 5,854* 4,122* 2,748*   Anemia Panel: Recent Labs    03/14/24 0321 03/14/24 0940  VITAMINB12 774  --   FOLATE 10.7  --   FERRITIN  --  3,677*  TIBC  --  216*  IRON  --  27*  RETICCTPCT 1.6  --    Sepsis Labs: Recent Labs  Lab 03/13/24 0009 03/13/24 1256  PROCALCITON  --  2.44  LATICACIDVEN 1.7  --     Recent Results (from the past 240 hours)  Blood Culture (routine x 2)     Status: None (Preliminary result)   Collection Time: 03/13/24 12:09 AM   Specimen: BLOOD  Result Value Ref Range Status   Specimen Description   Final    BLOOD RIGHT ANTECUBITAL Performed at [facility], [address], [place], [place] [address]    Special Requests   Final    BOTTLES DRAWN AEROBIC AND ANAEROBIC Blood Culture adequate volume Performed at [facility], [address]  [place], [place] [address]    Culture   Final    NO GROWTH 3 DAYS Performed at [facility], [address] [place], [place] [address]    Report Status PENDING  Incomplete  Blood Culture (routine x 2)     Status: None (Preliminary result)   Collection Time: 03/13/24 12:09 AM   Specimen: BLOOD LEFT FOREARM  Result Value Ref Range Status   Specimen Description   Final    BLOOD LEFT FOREARM Performed at [facility], [address], [place], [place] [address]    Special Requests   Final    BOTTLES DRAWN AEROBIC AND ANAEROBIC Blood Culture adequate volume Performed at [facility], [address], [place], [place] [address]    Culture   Final    NO GROWTH 3 DAYS Performed at [facility], [address], [place], [place] [address]    Report Status PENDING  Incomplete  Resp panel by RT-PCR (RSV, Flu A&B, Covid) Anterior Nasal Swab     Status: None   Collection Time: 03/13/24  2:04 AM   Specimen: Anterior Nasal Swab  Result Value Ref Range Status   SARS Coronavirus 2 by RT PCR NEGATIVE NEGATIVE Final    Comment: (NOTE) SARS-CoV-2 target nucleic acids are NOT DETECTED.  The SARS-CoV-2 RNA is generally detectable in upper respiratory specimens during the acute phase of infection. The lowest concentration of SARS-CoV-2 viral copies this assay can detect is 138 copies/mL. A negative result does not preclude SARS-Cov-2 infection and should not be used as the sole basis for treatment or other patient management decisions. A negative result may occur with  improper specimen collection/handling, submission of specimen other than nasopharyngeal swab, presence of viral mutation(s) within the areas targeted by this assay, and inadequate number of viral copies(<138 copies/mL). A negative result must be combined with clinical observations, patient history, and epidemiological information. The expected result is Negative.  Fact Sheet for Patients:   BloggerCourse.com  Fact Sheet for Healthcare Providers:  SeriousBroker.it  This test is no t yet approved or cleared by the [place]  FDA and  has been authorized for detection and/or diagnosis of SARS-CoV-2 by FDA under an Emergency Use Authorization (EUA). This EUA will remain  in effect (meaning this test can be used) for the duration of the COVID-19 declaration under Section 564(b)(1) of the Act, 21 U.S.C.section 360bbb-3(b)(1), unless the authorization is terminated  or revoked sooner.       Influenza A by PCR NEGATIVE NEGATIVE Final   Influenza B by PCR NEGATIVE NEGATIVE Final    Comment: (NOTE) The Xpert Xpress SARS-CoV-2/FLU/RSV plus assay is intended as an aid in the diagnosis of influenza from Nasopharyngeal swab specimens and should not be used as a sole basis for treatment. Nasal washings and aspirates are unacceptable for Xpert Xpress SARS-CoV-2/FLU/RSV testing.  Fact Sheet for Patients: BloggerCourse.com  Fact Sheet for Healthcare Providers: SeriousBroker.it  This test is not yet approved or cleared by the [place]  FDA and has been authorized for detection and/or diagnosis of SARS-CoV-2 by FDA under an Emergency Use Authorization (EUA). This EUA will remain in effect (meaning this test can be used) for the duration of the COVID-19 declaration under Section 564(b)(1) of the Act, 21 U.S.C. section 360bbb-3(b)(1), unless the authorization is terminated or revoked.     Resp Syncytial Virus by PCR NEGATIVE NEGATIVE Final    Comment: (NOTE) Fact Sheet for Patients: BloggerCourse.com  Fact Sheet for Healthcare Providers: SeriousBroker.it  This test is not yet approved or cleared by the [place]  FDA and has been authorized for detection and/or diagnosis of SARS-CoV-2 by FDA under an Emergency Use  Authorization (EUA). This EUA will remain in effect (meaning this test can be used) for the duration of the COVID-19 declaration under Section 564(b)(1) of the Act, 21 U.S.C. section 360bbb-3(b)(1), unless the authorization is terminated or revoked.  Performed at [facility], [address], [place], [place] [address]   MRSA Next Gen by PCR, Nasal     Status: None   Collection Time: 03/13/24  8:23 AM   Specimen: Nasal Mucosa; Nasal Swab  Result Value Ref Range Status   MRSA by PCR Next Gen NOT DETECTED NOT DETECTED Final    Comment: (NOTE) The GeneXpert MRSA Assay (FDA approved for NASAL specimens only), is one component of a comprehensive MRSA colonization surveillance program. It is not intended to diagnose MRSA infection nor to  guide or monitor treatment for MRSA infections. Test performance is not FDA approved in patients less than 17 years old. Performed at [facility], [address] [place], [place] [address]   Urine Culture (for pregnant, neutropenic or urologic patients or patients with an indwelling urinary catheter)     Status: None   Collection Time: 03/13/24 10:55 AM   Specimen: Urine, Clean Catch  Result Value Ref Range Status   Specimen Description URINE, CLEAN CATCH  Final   Special Requests NONE  Final   Culture   Final    NO GROWTH Performed at [facility], [address], [place], [place] [address]    Report Status 03/14/2024 FINAL  Final  Respiratory (~20 pathogens) panel by PCR     Status: None   Collection Time: 03/13/24  6:20 PM   Specimen: Nasopharyngeal Swab; Respiratory  Result Value Ref Range Status   Adenovirus NOT DETECTED NOT DETECTED Final   Coronavirus 229E NOT DETECTED NOT DETECTED Final    Comment: (NOTE) The Coronavirus on the Respiratory Panel, DOES NOT test for the novel  Coronavirus (2019 nCoV)    Coronavirus HKU1 NOT DETECTED NOT DETECTED Final   Coronavirus NL63 NOT DETECTED NOT DETECTED Final    Coronavirus OC43 NOT DETECTED NOT DETECTED Final   Metapneumovirus NOT DETECTED NOT DETECTED Final   Rhinovirus / Enterovirus NOT DETECTED NOT DETECTED Final   Influenza A NOT DETECTED NOT DETECTED Final   Influenza B NOT DETECTED NOT DETECTED Final   Parainfluenza Virus 1 NOT DETECTED NOT DETECTED Final   Parainfluenza Virus 2 NOT DETECTED NOT DETECTED Final   Parainfluenza Virus 3 NOT DETECTED NOT DETECTED Final   Parainfluenza Virus 4 NOT DETECTED NOT DETECTED Final   Respiratory Syncytial Virus NOT DETECTED NOT DETECTED Final   Bordetella pertussis NOT DETECTED NOT DETECTED Final   Bordetella Parapertussis NOT DETECTED NOT DETECTED Final   Chlamydophila pneumoniae NOT DETECTED NOT DETECTED Final   Mycoplasma pneumoniae NOT DETECTED NOT DETECTED Final    Comment: Performed at [facility], [address] [place], [place] [address]     Component     Latest Ref Rng 03/13/2024 11:08 AM  L. pneumophila Serogp 1 Ur Ag     Negative  Positive !   Source of Sample URINE, RANDOM     Radiology Studies: VAS US  LOWER EXTREMITY VENOUS (DVT) Result Date: 03/15/2024  Lower Venous DVT Study Patient Name:  EVELIO RUEDA  Date of Exam:   03/15/2024 Medical Rec #: [id number]       Accession #:    [id number] Date of Birth: 25-May-1961      Patient Gender: M Patient Age:   33 years Exam Location:  [facility] Procedure:      VAS US  LOWER EXTREMITY VENOUS (DVT) Referring Phys: A POWELL JR --------------------------------------------------------------------------------  Indications: Edema.  Risk Factors: None identified. Comparison Study: No prior studies. Performing Technologist: Cordella Collet RVT  Examination Guidelines: A complete evaluation includes B-mode imaging, spectral Doppler, color Doppler, and power Doppler as needed of all accessible portions of each vessel. Bilateral testing is considered an integral part of a complete examination. Limited examinations for reoccurring indications  may be performed as noted. The reflux portion of the exam is performed with the patient in reverse Trendelenburg.  +---------+---------------+---------+-----------+----------+--------------+ RIGHT    CompressibilityPhasicitySpontaneityPropertiesThrombus Aging +---------+---------------+---------+-----------+----------+--------------+ CFV      Full           Yes  Yes                                 +---------+---------------+---------+-----------+----------+--------------+ SFJ      Full                                                        +---------+---------------+---------+-----------+----------+--------------+ FV Prox  Full                                                        +---------+---------------+---------+-----------+----------+--------------+ FV Mid   Full                                                        +---------+---------------+---------+-----------+----------+--------------+ FV DistalFull                                                        +---------+---------------+---------+-----------+----------+--------------+ PFV      Full                                                        +---------+---------------+---------+-----------+----------+--------------+ POP      Full           Yes      Yes                                 +---------+---------------+---------+-----------+----------+--------------+ PTV      Full                                                        +---------+---------------+---------+-----------+----------+--------------+ PERO     Full                                                        +---------+---------------+---------+-----------+----------+--------------+   +---------+---------------+---------+-----------+----------+--------------+ LEFT     CompressibilityPhasicitySpontaneityPropertiesThrombus Aging +---------+---------------+---------+-----------+----------+--------------+ CFV       Full           Yes      Yes                                 +---------+---------------+---------+-----------+----------+--------------+ SFJ      Full                                                        +---------+---------------+---------+-----------+----------+--------------+  FV Prox  Full                                                        +---------+---------------+---------+-----------+----------+--------------+ FV Mid   Full                                                        +---------+---------------+---------+-----------+----------+--------------+ FV DistalFull                                                        +---------+---------------+---------+-----------+----------+--------------+ PFV      Full                                                        +---------+---------------+---------+-----------+----------+--------------+ POP      Full           Yes      Yes                                 +---------+---------------+---------+-----------+----------+--------------+ PTV      Full                                                        +---------+---------------+---------+-----------+----------+--------------+ PERO     Full                                                        +---------+---------------+---------+-----------+----------+--------------+     Summary: RIGHT: - There is no evidence of deep vein thrombosis in the lower extremity.  - No cystic structure found in the popliteal fossa.  LEFT: - There is no evidence of deep vein thrombosis in the lower extremity.  - No cystic structure found in the popliteal fossa.  *See table(s) above for measurements and observations. Electronically signed by Gaile New MD on 03/15/2024 at 7:15:57 PM.    Final     Scheduled Meds:  acetaminophen   1,000 mg Oral Q8H   allopurinol   300 mg Oral Daily   enoxaparin  (LOVENOX ) injection  40 mg Subcutaneous Q24H   guaiFENesin   15 mL Oral  BID   [START ON 03/17/2024] levofloxacin   750 mg Oral Daily   pantoprazole  (PROTONIX ) IV  40 mg Intravenous Q24H   sodium chloride  flush  3 mL Intravenous Q12H   Continuous Infusions:   LOS: 3 days   Time spent: 60 minutes  Camellia Door, DO  [facility]  03/16/2024, 1:45 PM

## 2024-03-16 NOTE — Assessment & Plan Note (Addendum)
 03-16-2024 admission CT showed left side hydronephrosis.  Pt seen by urology who disputed pt has hydronephrosis. More likely he has renal pelviectasis. He does not need urology f/u.

## 2024-03-16 NOTE — Assessment & Plan Note (Signed)
 03-16-2024 CK initially 4152. Today, CK down to 2748. Advised pt to continue to drink plenty of water until the color of his urine is pale yellow. Avoid NSAIDs, diuretics and ACEI/ARB for now.

## 2024-03-16 NOTE — Assessment & Plan Note (Signed)
 03-16-2024 present on admission to hospital. WBC 14.7, CXR shows pneumonia, HR 113.

## 2024-03-16 NOTE — Subjective & Objective (Addendum)
 Pt seen and examined.  Pt states he was in [facility] last week before he got sick. He was staying in a motel in the basement where the carpet smelled wet and moldy. He became ill soon after he left the motel.   He wants to go home today.

## 2024-03-16 NOTE — Assessment & Plan Note (Signed)
 03-16-2024 on admission, treated as if he had CAP. Pt states he was in [facility] last week before he got sick. He was staying in a motel in the basement where the carpet smelled wet and moldy. He became ill soon after he left the motel. Pt has been on IV Rocephin /po zithromax  for the last several days. Legionella came back as positive yesterday. Will change over to po Levaquin  750 mg bid x 7 days.

## 2024-03-16 NOTE — Assessment & Plan Note (Addendum)
 03-16-2024 on admission, he has serum CO2 of 18. Pt placed on Ivf. His metabolic acidosis resolved. Bicarb up to 21 today.

## 2024-03-18 LAB — CULTURE, BLOOD (ROUTINE X 2)
Culture: NO GROWTH
Culture: NO GROWTH
Special Requests: ADEQUATE
Special Requests: ADEQUATE

## 2024-03-20 LAB — LEGIONELLA PNEUMOPHILA SEROGP 1 UR AG: L. pneumophila Serogp 1 Ur Ag: POSITIVE — AB

## 2024-08-19 ENCOUNTER — Other Ambulatory Visit: Payer: Self-pay

## 2024-08-19 ENCOUNTER — Emergency Department (HOSPITAL_BASED_OUTPATIENT_CLINIC_OR_DEPARTMENT_OTHER)
Admission: EM | Admit: 2024-08-19 | Discharge: 2024-08-19 | Disposition: A | Discharge status: Home / Self Care | Attending: Emergency Medicine | Admitting: Emergency Medicine

## 2024-08-19 DIAGNOSIS — R112 Nausea with vomiting, unspecified: Secondary | ICD-10-CM | POA: Diagnosis present

## 2024-08-19 DIAGNOSIS — R000 Tachycardia, unspecified: Secondary | ICD-10-CM | POA: Diagnosis not present

## 2024-08-19 DIAGNOSIS — R197 Diarrhea, unspecified: Secondary | ICD-10-CM | POA: Diagnosis not present

## 2024-08-19 LAB — CBC WITH DIFFERENTIAL/PLATELET
Abs Immature Granulocytes: 0.02 10*3/uL (ref 0.00–0.07)
Basophils Absolute: 0 10*3/uL (ref 0.0–0.1)
Basophils Relative: 0 %
Eosinophils Absolute: 0 10*3/uL (ref 0.0–0.5)
Eosinophils Relative: 0 %
HCT: 39.1 % (ref 39.0–52.0)
Hemoglobin: 13.1 g/dL (ref 13.0–17.0)
Immature Granulocytes: 0 %
Lymphocytes Relative: 9 %
Lymphs Abs: 0.9 10*3/uL (ref 0.7–4.0)
MCH: 30.4 pg (ref 26.0–34.0)
MCHC: 33.5 g/dL (ref 30.0–36.0)
MCV: 90.7 fL (ref 80.0–100.0)
Monocytes Absolute: 0.7 10*3/uL (ref 0.1–1.0)
Monocytes Relative: 8 %
Neutro Abs: 7.7 10*3/uL (ref 1.7–7.7)
Neutrophils Relative %: 83 %
Platelets: 328 10*3/uL (ref 150–400)
RBC: 4.31 MIL/uL (ref 4.22–5.81)
RDW: 12.9 % (ref 11.5–15.5)
WBC: 9.3 10*3/uL (ref 4.0–10.5)
nRBC: 0 % (ref 0.0–0.2)

## 2024-08-19 LAB — COMPREHENSIVE METABOLIC PANEL WITH GFR
ALT: 31 U/L (ref 0–44)
AST: 25 U/L (ref 15–41)
Albumin: 4.6 g/dL (ref 3.5–5.0)
Alkaline Phosphatase: 85 U/L (ref 38–126)
Anion gap: 14 (ref 5–15)
BUN: 23 mg/dL (ref 8–23)
CO2: 24 mmol/L (ref 22–32)
Calcium: 10 mg/dL (ref 8.9–10.3)
Chloride: 102 mmol/L (ref 98–111)
Creatinine, Ser: 1.02 mg/dL (ref 0.61–1.24)
GFR, Estimated: >60 mL/min
Glucose, Bld: 124 mg/dL — ABNORMAL HIGH (ref 70–99)
Potassium: 3.5 mmol/L (ref 3.5–5.1)
Sodium: 140 mmol/L (ref 135–145)
Total Bilirubin: 0.9 mg/dL (ref 0.0–1.2)
Total Protein: 8.1 g/dL (ref 6.5–8.1)

## 2024-08-19 LAB — LIPASE, BLOOD: Lipase: 11 U/L (ref 11–51)

## 2024-08-19 MED ORDER — ONDANSETRON HCL 4 MG/2ML IJ SOLN
4.0000 mg | Freq: Once | INTRAMUSCULAR | Status: AC
  Administered 2024-08-19: 4 mg via INTRAVENOUS
  Filled 2024-08-19: qty 2

## 2024-08-19 MED ORDER — LOPERAMIDE HCL 2 MG PO CAPS
4.0000 mg | ORAL_CAPSULE | Freq: Once | ORAL | Status: AC
  Administered 2024-08-19: 4 mg via ORAL
  Filled 2024-08-19: qty 2

## 2024-08-19 MED ORDER — SODIUM CHLORIDE 0.9 % IV BOLUS
1000.0000 mL | Freq: Once | INTRAVENOUS | Status: AC
  Administered 2024-08-19: 1000 mL via INTRAVENOUS

## 2024-08-19 MED ORDER — ONDANSETRON 4 MG PO TBDP: ORAL_TABLET | ORAL | 0 refills | Status: AC

## 2024-08-19 NOTE — ED Provider Notes (Signed)
 "  EMERGENCY DEPARTMENT AT [facility] Provider Note   CSN: [id number] Arrival date & time: 08/19/24  9290     Patient presents with: Emesis   Daniel Velez. is a 64 y.o. male.   64 yo M with a chief complaints of nausea vomiting and diarrhea.  This started yesterday afternoon.  He has been able to keep down some liquids but has not really had a thing to eat.  Denies abdominal pain.  Denies dark or bloody stool.  He tells me he had some stewed beef in a cafeteria setting and then some pizza.  He was wondering if maybe was food poisoning.  No international travel.  No fevers.   Emesis      Prior to Admission medications  Medication Sig Start Date End Date Taking? Authorizing Provider  ondansetron  (ZOFRAN -ODT) 4 MG disintegrating tablet 4mg  ODT q4 hours prn nausea/vomit 08/19/24  Yes Eyana Stolze, DO  allopurinol  (ZYLOPRIM ) 300 MG tablet Take 300 mg by mouth daily. 02/27/22   [provider]  [Paused] BENICAR HCT 40-25 MG per tablet Take 1 tablet by mouth daily. Wait to take this until your doctor or other care provider tells you to start again. 04/13/11   [provider]  Colchicine 0.6 MG CAPS Take 0.6 mg by mouth as needed.    [provider]  fluticasone (FLONASE) 50 MCG/ACT nasal spray Place 1 spray into both nostrils 2 (two) times daily as needed. 02/27/22   [provider]  methocarbamol  (ROBAXIN ) 500 MG tablet Take 500 mg by mouth every 8 (eight) hours as needed for muscle spasms. 02/24/24   [provider]  OVER THE COUNTER MEDICATION daily. Walitin Allergy OTC    [provider]  promethazine-dextromethorphan  (PROMETHAZINE-DM) 6.25-15 MG/5ML syrup Take 10 mLs by mouth every 4 (four) hours as needed for cough. 03/10/24   [provider]    Allergies: Patient has no known allergies.    Review of Systems  Gastrointestinal:  Positive for vomiting.    Updated Vital Signs BP 109/77   Pulse 99    Temp 98.4 F (36.9 C) (Oral)   Resp 20   SpO2 92%   Physical Exam Vitals and nursing note reviewed.  Constitutional:      Appearance: He is well-developed.  HENT:     Head: Normocephalic and atraumatic.  Eyes:     Pupils: Pupils are equal, round, and reactive to light.  Neck:     Vascular: No JVD.  Cardiovascular:     Rate and Rhythm: Regular rhythm. Tachycardia present.     Heart sounds: No murmur heard.    No friction rub. No gallop.  Pulmonary:     Effort: No respiratory distress.     Breath sounds: No wheezing.  Abdominal:     General: There is no distension.     Tenderness: There is no abdominal tenderness. There is no guarding or rebound.  Musculoskeletal:        General: Normal range of motion.     Cervical back: Normal range of motion and neck supple.  Skin:    Coloration: Skin is not pale.     Findings: No rash.  Neurological:     Mental Status: He is alert and oriented to person, place, and time.  Psychiatric:        Behavior: Behavior normal.     (all labs ordered are listed, but only abnormal results are displayed) Labs Reviewed  COMPREHENSIVE METABOLIC PANEL WITH  GFR - Abnormal; Notable for the following components:      Result Value   Glucose, Bld 124 (*)    All other components within normal limits  CBC WITH DIFFERENTIAL/PLATELET  LIPASE, BLOOD    EKG: None  Radiology: No results found.   Procedures   Medications Ordered in the ED  sodium chloride  0.9 % bolus 1,000 mL (1,000 mLs Intravenous New Bag/Given 08/19/24 0737)  ondansetron  (ZOFRAN ) injection 4 mg (4 mg Intravenous Given 08/19/24 0738)  loperamide  (IMODIUM ) capsule 4 mg (4 mg Oral Given 08/19/24 9261)                                    Medical Decision Making Amount and/or Complexity of Data Reviewed Labs: ordered.  Risk Prescription drug management.   64 yo M with a chief complaints of nausea vomiting and diarrhea.  Started yesterday afternoon.  Patient tachycardic in the  120s on arrival here.  Will give a bolus of IV fluids check blood work.  Reassess.  Patient feeling much better on repeat assessment.  Tachycardia has resolved.  No anemia no leukocytosis no significant electrolyte abnormalities.  Tolerating by mouth here.  Will discharge home.  PCP follow-up.  8:17 AM:  I have discussed the diagnosis/risks/treatment options with the patient.  Evaluation and diagnostic testing in the emergency department does not suggest an emergent condition requiring admission or immediate intervention beyond what has been performed at this time.  They will follow up with PCP. We also discussed returning to the ED immediately if new or worsening sx occur. We discussed the sx which are most concerning (e.g., sudden worsening pain, fever, inability to tolerate by mouth) that necessitate immediate return. Medications administered to the patient during their visit and any new prescriptions provided to the patient are listed below.  Medications given during this visit Medications  sodium chloride  0.9 % bolus 1,000 mL (1,000 mLs Intravenous New Bag/Given 08/19/24 0737)  ondansetron  (ZOFRAN ) injection 4 mg (4 mg Intravenous Given 08/19/24 0738)  loperamide  (IMODIUM ) capsule 4 mg (4 mg Oral Given 08/19/24 9261)     The patient appears reasonably screen and/or stabilized for discharge and I doubt any other medical condition or other [facility] requiring further screening, evaluation, or treatment in the ED at this time prior to discharge.       Final diagnoses:  Nausea vomiting and diarrhea    ED Discharge Orders          Ordered    ondansetron  (ZOFRAN -ODT) 4 MG disintegrating tablet        08/19/24 0816               Emil Share, DO 08/19/24 9182  "

## 2024-08-19 NOTE — ED Triage Notes (Signed)
 Pt reports eating lunch at work and then having pizza at home for dinner yesterday. Reports not long after dinner, started having multiple BMs, but denies diarrhea. Reports then had 5-6 emesis occurrences overnight. Denies any pain/fever/chills.

## 2024-08-19 NOTE — Discharge Instructions (Signed)
 I have prescribed medication for nausea.  You can take Imodium  for diarrhea.  This is over-the-counter.  Please follow the instructions on the bottle.  As we discussed please return for abdominal pain fever inability to eat or drink.
